# Patient Record
Sex: Male | Born: 1960 | Race: White | Hispanic: No | Marital: Married | Smoking: Former smoker
Health system: Southern US, Community
[De-identification: ages and names within clinical notes are randomized; demographics above are authoritative.]

## PROBLEM LIST (undated history)

## (undated) DIAGNOSIS — I10 Essential (primary) hypertension: Secondary | ICD-10-CM

## (undated) DIAGNOSIS — J449 Chronic obstructive pulmonary disease, unspecified: Secondary | ICD-10-CM

## (undated) DIAGNOSIS — J45909 Unspecified asthma, uncomplicated: Secondary | ICD-10-CM

## (undated) HISTORY — PX: KNEE SURGERY: SHX244

## (undated) HISTORY — PX: WISDOM TOOTH EXTRACTION: SHX21

---

## 2020-08-12 ENCOUNTER — Inpatient Hospital Stay
Admission: EM | Admit: 2020-08-12 | Discharge: 2020-08-14 | DRG: 871 | Disposition: A | Discharge status: Home / Self Care | Payer: BC Managed Care – PPO | Source: Ambulatory Visit | Attending: Internal Medicine | Admitting: Internal Medicine

## 2020-08-12 ENCOUNTER — Other Ambulatory Visit: Payer: Self-pay

## 2020-08-12 ENCOUNTER — Emergency Department: Payer: BC Managed Care – PPO

## 2020-08-12 DIAGNOSIS — J4541 Moderate persistent asthma with (acute) exacerbation: Secondary | ICD-10-CM

## 2020-08-12 DIAGNOSIS — E876 Hypokalemia: Secondary | ICD-10-CM | POA: Diagnosis present

## 2020-08-12 DIAGNOSIS — Z87891 Personal history of nicotine dependence: Secondary | ICD-10-CM

## 2020-08-12 DIAGNOSIS — U071 COVID-19: Secondary | ICD-10-CM | POA: Diagnosis present

## 2020-08-12 DIAGNOSIS — I10 Essential (primary) hypertension: Secondary | ICD-10-CM | POA: Diagnosis present

## 2020-08-12 DIAGNOSIS — R0902 Hypoxemia: Secondary | ICD-10-CM

## 2020-08-12 DIAGNOSIS — J440 Chronic obstructive pulmonary disease with acute lower respiratory infection: Secondary | ICD-10-CM | POA: Diagnosis present

## 2020-08-12 DIAGNOSIS — J449 Chronic obstructive pulmonary disease, unspecified: Secondary | ICD-10-CM | POA: Diagnosis not present

## 2020-08-12 DIAGNOSIS — R197 Diarrhea, unspecified: Secondary | ICD-10-CM | POA: Diagnosis present

## 2020-08-12 DIAGNOSIS — R0602 Shortness of breath: Secondary | ICD-10-CM

## 2020-08-12 DIAGNOSIS — Z825 Family history of asthma and other chronic lower respiratory diseases: Secondary | ICD-10-CM | POA: Diagnosis not present

## 2020-08-12 DIAGNOSIS — I2699 Other pulmonary embolism without acute cor pulmonale: Secondary | ICD-10-CM | POA: Diagnosis present

## 2020-08-12 DIAGNOSIS — A419 Sepsis, unspecified organism: Secondary | ICD-10-CM | POA: Diagnosis not present

## 2020-08-12 DIAGNOSIS — J441 Chronic obstructive pulmonary disease with (acute) exacerbation: Secondary | ICD-10-CM | POA: Diagnosis present

## 2020-08-12 DIAGNOSIS — J9601 Acute respiratory failure with hypoxia: Secondary | ICD-10-CM | POA: Diagnosis present

## 2020-08-12 DIAGNOSIS — Z8249 Family history of ischemic heart disease and other diseases of the circulatory system: Secondary | ICD-10-CM | POA: Diagnosis not present

## 2020-08-12 DIAGNOSIS — J45901 Unspecified asthma with (acute) exacerbation: Secondary | ICD-10-CM | POA: Diagnosis present

## 2020-08-12 DIAGNOSIS — J1282 Pneumonia due to coronavirus disease 2019: Secondary | ICD-10-CM | POA: Diagnosis present

## 2020-08-12 DIAGNOSIS — A4189 Other specified sepsis: Secondary | ICD-10-CM | POA: Diagnosis present

## 2020-08-12 HISTORY — DX: Chronic obstructive pulmonary disease, unspecified: J44.9

## 2020-08-12 HISTORY — DX: Essential (primary) hypertension: I10

## 2020-08-12 HISTORY — DX: Unspecified asthma, uncomplicated: J45.909

## 2020-08-12 LAB — COMPREHENSIVE METABOLIC PANEL
ALT: 33 U/L (ref 0–44)
AST: 31 U/L (ref 15–41)
Albumin: 4.1 g/dL (ref 3.5–5.0)
Alkaline Phosphatase: 62 U/L (ref 38–126)
Anion gap: 11 (ref 5–15)
BUN: 18 mg/dL (ref 6–20)
CO2: 25 mmol/L (ref 22–32)
Calcium: 8.9 mg/dL (ref 8.9–10.3)
Chloride: 101 mmol/L (ref 98–111)
Creatinine, Ser: 0.77 mg/dL (ref 0.61–1.24)
GFR, Estimated: >60 mL/min (ref >60)
Glucose, Bld: 143 mg/dL — ABNORMAL HIGH (ref 70–99)
Potassium: 3.4 mmol/L — ABNORMAL LOW (ref 3.5–5.1)
Sodium: 137 mmol/L (ref 135–145)
Total Bilirubin: 1.2 mg/dL (ref 0.3–1.2)
Total Protein: 7.6 g/dL (ref 6.5–8.1)

## 2020-08-12 LAB — HIV ANTIBODY (ROUTINE TESTING W REFLEX): HIV Screen 4th Generation wRfx: NONREACTIVE

## 2020-08-12 LAB — CBC WITH DIFFERENTIAL/PLATELET
Abs Immature Granulocytes: 0.08 10*3/uL — ABNORMAL HIGH (ref 0.00–0.07)
Basophils Absolute: 0.1 10*3/uL (ref 0.0–0.1)
Basophils Relative: 0 %
Eosinophils Absolute: 0 10*3/uL (ref 0.0–0.5)
Eosinophils Relative: 0 %
HCT: 43.7 % (ref 39.0–52.0)
Hemoglobin: 15 g/dL (ref 13.0–17.0)
Immature Granulocytes: 0 %
Lymphocytes Relative: 7 %
Lymphs Abs: 1.2 10*3/uL (ref 0.7–4.0)
MCH: 29.6 pg (ref 26.0–34.0)
MCHC: 34.3 g/dL (ref 30.0–36.0)
MCV: 86.2 fL (ref 80.0–100.0)
Monocytes Absolute: 2.2 10*3/uL — ABNORMAL HIGH (ref 0.1–1.0)
Monocytes Relative: 12 %
Neutro Abs: 14.5 10*3/uL — ABNORMAL HIGH (ref 1.7–7.7)
Neutrophils Relative %: 81 %
Platelets: 236 10*3/uL (ref 150–400)
RBC: 5.07 MIL/uL (ref 4.22–5.81)
RDW: 12.9 % (ref 11.5–15.5)
WBC: 18.1 10*3/uL — ABNORMAL HIGH (ref 4.0–10.5)
nRBC: 0 % (ref 0.0–0.2)

## 2020-08-12 LAB — C-REACTIVE PROTEIN: CRP: 15.7 mg/dL — ABNORMAL HIGH (ref <1.0)

## 2020-08-12 LAB — D-DIMER, QUANTITATIVE: D-Dimer, Quant: 1.69 ug/mL-FEU — ABNORMAL HIGH (ref 0.00–0.50)

## 2020-08-12 LAB — RESP PANEL BY RT-PCR (FLU A&B, COVID) ARPGX2
Influenza A by PCR: NEGATIVE
Influenza B by PCR: NEGATIVE
SARS Coronavirus 2 by RT PCR: NEGATIVE

## 2020-08-12 LAB — FERRITIN: Ferritin: 189 ng/mL (ref 24–336)

## 2020-08-12 LAB — LACTIC ACID, PLASMA
Lactic Acid, Venous: 1.2 mmol/L (ref 0.5–1.9)
Lactic Acid, Venous: 1.7 mmol/L (ref 0.5–1.9)

## 2020-08-12 LAB — PROCALCITONIN: Procalcitonin: <0.1 ng/mL

## 2020-08-12 LAB — MAGNESIUM: Magnesium: 1.7 mg/dL (ref 1.7–2.4)

## 2020-08-12 MED ORDER — DM-GUAIFENESIN ER 30-600 MG PO TB12
1.0000 | ORAL_TABLET | Freq: Two times a day (BID) | ORAL | Status: DC | PRN
  Administered 2020-08-13: 09:00:00 1 via ORAL
  Filled 2020-08-12: qty 1

## 2020-08-12 MED ORDER — ONDANSETRON HCL 4 MG/2ML IJ SOLN: 4.0000 mg | Freq: Three times a day (TID) | INTRAMUSCULAR | Status: DC | PRN

## 2020-08-12 MED ORDER — METHYLPREDNISOLONE SODIUM SUCC 40 MG IJ SOLR
40.0000 mg | Freq: Two times a day (BID) | INTRAMUSCULAR | Status: DC
  Administered 2020-08-12 – 2020-08-13 (×2): 40 mg via INTRAVENOUS
  Filled 2020-08-12 (×2): qty 1

## 2020-08-12 MED ORDER — LISINOPRIL 20 MG PO TABS
40.0000 mg | ORAL_TABLET | Freq: Every day | ORAL | Status: DC
  Administered 2020-08-13 – 2020-08-14 (×2): 40 mg via ORAL
  Filled 2020-08-12 (×2): qty 2

## 2020-08-12 MED ORDER — SODIUM CHLORIDE 0.9 % IV SOLN
200.0000 mg | Freq: Once | INTRAVENOUS | Status: AC
  Administered 2020-08-12: 200 mg via INTRAVENOUS
  Filled 2020-08-12: qty 200

## 2020-08-12 MED ORDER — SODIUM CHLORIDE 0.9 % IV SOLN
100.0000 mg | Freq: Every day | INTRAVENOUS | Status: DC
  Administered 2020-08-13: 09:00:00 100 mg via INTRAVENOUS
  Filled 2020-08-12 (×2): qty 20

## 2020-08-12 MED ORDER — ACETAMINOPHEN 325 MG PO TABS: 650.0000 mg | ORAL_TABLET | Freq: Four times a day (QID) | ORAL | Status: DC | PRN

## 2020-08-12 MED ORDER — SODIUM CHLORIDE 0.9 % IV BOLUS
500.0000 mL | Freq: Once | INTRAVENOUS | Status: AC
  Administered 2020-08-12: 500 mL via INTRAVENOUS

## 2020-08-12 MED ORDER — ENOXAPARIN SODIUM 40 MG/0.4ML IJ SOSY: 40.0000 mg | PREFILLED_SYRINGE | INTRAMUSCULAR | Status: DC

## 2020-08-12 MED ORDER — LOPERAMIDE HCL 2 MG PO CAPS: 2.0000 mg | ORAL_CAPSULE | Freq: Two times a day (BID) | ORAL | Status: DC | PRN

## 2020-08-12 MED ORDER — AMLODIPINE BESYLATE 10 MG PO TABS
10.0000 mg | ORAL_TABLET | Freq: Every day | ORAL | Status: DC
  Administered 2020-08-13 – 2020-08-14 (×2): 10 mg via ORAL
  Filled 2020-08-12 (×2): qty 1

## 2020-08-12 MED ORDER — AZITHROMYCIN 500 MG PO TABS
250.0000 mg | ORAL_TABLET | Freq: Every day | ORAL | Status: DC
  Administered 2020-08-13: 250 mg via ORAL
  Filled 2020-08-12: qty 1

## 2020-08-12 MED ORDER — IPRATROPIUM BROMIDE HFA 17 MCG/ACT IN AERS
2.0000 | INHALATION_SPRAY | RESPIRATORY_TRACT | Status: DC
  Administered 2020-08-12 – 2020-08-14 (×10): 2 via RESPIRATORY_TRACT
  Filled 2020-08-12 (×2): qty 12.9

## 2020-08-12 MED ORDER — HYDRALAZINE HCL 20 MG/ML IJ SOLN
5.0000 mg | INTRAMUSCULAR | Status: DC | PRN
  Administered 2020-08-12: 5 mg via INTRAVENOUS
  Filled 2020-08-12: qty 1

## 2020-08-12 MED ORDER — AZITHROMYCIN 500 MG PO TABS
500.0000 mg | ORAL_TABLET | Freq: Every day | ORAL | Status: AC
  Administered 2020-08-12: 500 mg via ORAL
  Filled 2020-08-12: qty 1

## 2020-08-12 MED ORDER — LABETALOL HCL 5 MG/ML IV SOLN
5.0000 mg | INTRAVENOUS | Status: DC | PRN
  Administered 2020-08-13: 21:00:00 5 mg via INTRAVENOUS
  Filled 2020-08-12: qty 4

## 2020-08-12 MED ORDER — DEXAMETHASONE SODIUM PHOSPHATE 10 MG/ML IJ SOLN
10.0000 mg | Freq: Once | INTRAMUSCULAR | Status: DC
  Filled 2020-08-12: qty 1

## 2020-08-12 MED ORDER — ENOXAPARIN SODIUM 60 MG/0.6ML IJ SOSY
0.5000 mg/kg | PREFILLED_SYRINGE | INTRAMUSCULAR | Status: DC
  Administered 2020-08-12: 47.5 mg via SUBCUTANEOUS
  Filled 2020-08-12: qty 0.6

## 2020-08-12 MED ORDER — SODIUM CHLORIDE 0.9 % IV BOLUS
1000.0000 mL | Freq: Once | INTRAVENOUS | Status: AC
  Administered 2020-08-12: 1000 mL via INTRAVENOUS

## 2020-08-12 MED ORDER — ALBUTEROL SULFATE HFA 108 (90 BASE) MCG/ACT IN AERS
2.0000 | INHALATION_SPRAY | RESPIRATORY_TRACT | Status: DC | PRN
  Filled 2020-08-12: qty 6.7

## 2020-08-12 MED ORDER — POTASSIUM CHLORIDE CRYS ER 20 MEQ PO TBCR
40.0000 meq | EXTENDED_RELEASE_TABLET | Freq: Once | ORAL | Status: AC
  Administered 2020-08-12: 40 meq via ORAL
  Filled 2020-08-12: qty 2

## 2020-08-12 NOTE — ED Notes (Addendum)
1 set of cultures, grey, and blue sent to lab

## 2020-08-12 NOTE — Progress Notes (Signed)
PHARMACIST - PHYSICIAN COMMUNICATION  CONCERNING:  Enoxaparin (Lovenox) for DVT Prophylaxis    RECOMMENDATION: Patient was prescribed enoxaprin 40mg  q24 hours for VTE prophylaxis.   Filed Weights   08/12/20 1555  Weight: 97.1 kg (214 lb)    Body mass index is 30.71 kg/m.  Estimated Creatinine Clearance: 116.2 mL/min (by C-G formula based on SCr of 0.77 mg/dL).   Based on [facility] policy patient is candidate for enoxaparin 0.5mg /kg TBW SQ every 24 hours based on BMI being >30.  DESCRIPTION: Pharmacy has adjusted enoxaparin dose per [facility] policy.  Patient is now receiving enoxaparin 47.5 mg every 24 hours    [facility], PharmD, BCPS Clinical Pharmacist 08/12/2020 5:36 PM

## 2020-08-12 NOTE — ED Provider Notes (Signed)
[facility] Emergency Department Provider Note   ____________________________________________   I have reviewed the triage vital signs and the nursing notes.   HISTORY  Chief Complaint Shortness of Breath   History limited by: Not Limited   HPI Timothy Keller is a 60 y.o. male who presents to the emergency department today from urgent care because of concern for shortness of breath and hypoxia in the setting of positive rapid covid test. The patient states that he has been feeling bad for the past roughly 3 days. His daughter had been sick although tested negative for COVID. The patient states his symptoms have involved shortness of breath and cough. Has had some diarrhea. Weakness. The patient does have history of asthma and used his home treatments with temporary relief.    Records reviewed. Per medical record review patient has a history of asthma,copd.   Past Medical History:  Diagnosis Date  . Asthma   . COPD (chronic obstructive pulmonary disease) (HCC)     There are no problems to display for this patient.   Past Surgical History:  Procedure Laterality Date  . KNEE SURGERY    . WISDOM TOOTH EXTRACTION      Prior to Admission medications   Not on File    Allergies Patient has no known allergies.  No family history on file.  Social History Social History   Tobacco Use  . Smoking status: Never Smoker    Review of Systems Constitutional: Positive for generalized weakness.  Eyes: No visual changes. ENT: No sore throat. Cardiovascular: Denies chest pain. Respiratory: Positive for shortness of breath. Gastrointestinal: Positive for diarrhea.  Genitourinary: Negative for dysuria. Musculoskeletal: Negative for back pain. Skin: Negative for rash. Neurological: Negative for headaches, focal weakness or numbness.  ____________________________________________   PHYSICAL EXAM:  VITAL SIGNS: ED Triage Vitals  Enc Vitals Group      BP 08/12/20 1554 (!) 174/97     Pulse Rate 08/12/20 1554 (!) 117     Resp 08/12/20 1554 (!) 24     Temp 08/12/20 1554 98.8 F (37.1 C)     Temp Source 08/12/20 1554 Oral     SpO2 08/12/20 1554 98 %     Weight 08/12/20 1555 214 lb (97.1 kg)     Height 08/12/20 1555 5\' 10"  (1.778 m)     Head Circumference --      Peak Flow --      Pain Score 08/12/20 1555 0    Constitutional: Alert and oriented.  Eyes: Conjunctivae are normal.  ENT      Head: Normocephalic and atraumatic.      Nose: No congestion/rhinnorhea.      Mouth/Throat: Mucous membranes are moist.      Neck: No stridor. Hematological/Lymphatic/Immunilogical: No cervical lymphadenopathy. Cardiovascular: Tachycardic, regular rhythm.  No murmurs, rubs, or gallops.  Respiratory: Slightly increased respiratory effort. Diffuse expiratory wheezing.  Gastrointestinal: Soft and non tender. No rebound. No guarding.  Genitourinary: Deferred Musculoskeletal: Normal range of motion in all extremities. No lower extremity edema. Neurologic:  Normal speech and language. No gross focal neurologic deficits are appreciated.  Skin:  Skin is warm, dry and intact. No rash noted. Psychiatric: Mood and affect are normal. Speech and behavior are normal. Patient exhibits appropriate insight and judgment.  ____________________________________________    LABS (pertinent positives/negatives)  CBC wbc 18.1, hgb 15.0, plt 236 CMP wnl except k 3.4, glu 143  ____________________________________________   EKG  I, 08/14/20, attending physician, personally viewed and interpreted  this EKG  EKG Time: 1604 Rate: 115 Rhythm: sinus tachycardia Axis: right axis deviation Intervals: qtc 497 QRS: RBBB, LPFB ST changes: no st elevation Impression: abnormal ekg  ____________________________________________    RADIOLOGY  CXR No active  disease  ____________________________________________   PROCEDURES  Procedures  ____________________________________________   INITIAL IMPRESSION / ASSESSMENT AND PLAN / ED COURSE  Pertinent labs & imaging results that were available during my care of the patient were reviewed by me and considered in my medical decision making (see chart for details).  Patient presents to the emergency department today because of concern for shortness of breath and hypoxia in the setting of positive covid test at urgent care. On exam here patient does have increased work of breathing and diffuse expiratory wheezing. Patient was found to be hypoxic to 88 on room air while talking. Patient was placed on 2L [place]. Will plan on starting steroids and remdisivir. Will plan on admission to the hospitalist service.  ____________________________________________   FINAL CLINICAL IMPRESSION(S) / ED DIAGNOSES  Final diagnoses:  SOB (shortness of breath)  Hypoxia     Note: This dictation was prepared with Dragon dictation. Any transcriptional errors that result from this process are unintentional     Phineas Semen, MD 08/12/20 [id number]

## 2020-08-12 NOTE — H&P (Addendum)
History and Physical    Timothy Keller QMG:[id number] DOB: [birth year] DOA: 08/12/2020  Referring MD/NP/PA:   PCP: Pcp, No   Patient coming from:  The patient is coming from home.  At baseline, pt is independent for most of ADL.        Chief Complaint: Shortness of breath, cough  HPI: Timothy Keller is a 60 y.o. male with medical history significant of COPD, asthma, HTN, who presents with shortness breath and cough.  Patient states that he has been feeling sick for more than 3 days.  Patient has dry cough and shortness breath, which has been progressively worsening.  Has chest tightness, denies chest pain.  No fever or chills.  He has malaise and generalized weakness. His daughter had been sick although tested negative for COVID.  Patient has some mild diarrhea, denies nausea, vomiting or abdominal pain.  No symptoms of UTI. Patient was seen in urgent care today, and had positive rapid COVID test, therefore sent to ED for further evaluation and treatment. He was found to have oxygen desaturated to 80s on room air, 91-98% on 2 L oxygen in ED. Patient states that he received 2 doses of COVID-19 vaccine.  ED Course: pt was found to have WBC 18.1, pending COVID-19 PCR, potassium 3.4, renal function okay, temperature normal, blood pressure 137/89, heart rate 117, RR 24, chest x-ray negative for infiltration.  Patient is admitted to MedSurg bed as inpatient.   Review of Systems:   General: no fevers, chills, no body weight gain, has fatigue HEENT: no blurry vision, hearing changes or sore throat Respiratory: has dyspnea, coughing, wheezing CV: no chest pain, no palpitations GI: no nausea, vomiting, abdominal pain, has diarrhea, no constipation GU: no dysuria, burning on urination, increased urinary frequency, hematuria  Ext: no leg edema Neuro: no unilateral weakness, numbness, or tingling, no vision change or hearing loss Skin: no rash, no skin tear. MSK: No muscle spasm, no deformity, no  limitation of range of movement in spin Heme: No easy bruising.  Travel history: No recent long distant travel.  Allergy: No Known Allergies  Past Medical History:  Diagnosis Date  . Asthma   . COPD (chronic obstructive pulmonary disease) (HCC)   . HTN (hypertension)     Past Surgical History:  Procedure Laterality Date  . KNEE SURGERY    . WISDOM TOOTH EXTRACTION      Social History:  reports that he has quit smoking. He has never used smokeless tobacco. He reports previous alcohol use. He reports that he does not use drugs.  Family History:  Family History  Problem Relation Age of Onset  . Asthma Mother   . Hypertension Father      Prior to Admission medications   Not on File    Physical Exam: Vitals:   08/12/20 1554 08/12/20 1555 08/12/20 1630  BP: (!) 174/97  137/89  Pulse: (!) 117  (!) 114  Resp: (!) 24  20  Temp: 98.8 F (37.1 C)    TempSrc: Oral    SpO2: 98%  91%  Weight:  97.1 kg   Height:  5\' 10"  (1.778 m)    General: Not in acute distress HEENT:       Eyes: PERRL, EOMI, no scleral icterus.       ENT: No discharge from the ears and nose, no pharynx injection, no tonsillar enlargement.        Neck: No JVD, no bruit, no mass felt. Heme: No neck lymph node enlargement.  Cardiac: S1/S2, RRR, No murmurs, No gallops or rubs. Respiratory: Has wheezing bilaterally GI: Soft, nondistended, nontender, no rebound pain, no organomegaly, BS present. GU: No hematuria Ext: No pitting leg edema bilaterally. 1+DP/PT pulse bilaterally. Musculoskeletal: No joint deformities, No joint redness or warmth, no limitation of ROM in spin. Skin: No rashes.  Neuro: Alert, oriented X3, cranial nerves II-XII grossly intact, moves all extremities normally.   Psych: Patient is not psychotic, no suicidal or hemocidal ideation.  Labs on Admission: I have personally reviewed following labs and imaging studies  CBC: Recent Labs  Lab 08/12/20 1611  WBC 18.1*  NEUTROABS 14.5*   HGB 15.0  HCT 43.7  MCV 86.2  PLT 236   Basic Metabolic Panel: Recent Labs  Lab 08/12/20 1611  NA 137  K 3.4*  CL 101  CO2 25  GLUCOSE 143*  BUN 18  CREATININE 0.77  CALCIUM 8.9  MG 1.7   GFR: Estimated Creatinine Clearance: 116.2 mL/min (by C-G formula based on SCr of 0.77 mg/dL). Liver Function Tests: Recent Labs  Lab 08/12/20 1611  AST 31  ALT 33  ALKPHOS 62  BILITOT 1.2  PROT 7.6  ALBUMIN 4.1   No results for input(s): LIPASE, AMYLASE in the last 168 hours. No results for input(s): AMMONIA in the last 168 hours. Coagulation Profile: No results for input(s): INR, PROTIME in the last 168 hours. Cardiac Enzymes: No results for input(s): CKTOTAL, CKMB, CKMBINDEX, TROPONINI in the last 168 hours. BNP (last 3 results) No results for input(s): PROBNP in the last 8760 hours. HbA1C: No results for input(s): HGBA1C in the last 72 hours. CBG: No results for input(s): GLUCAP in the last 168 hours. Lipid Profile: No results for input(s): CHOL, HDL, LDLCALC, TRIG, CHOLHDL, LDLDIRECT in the last 72 hours. Thyroid Function Tests: No results for input(s): TSH, T4TOTAL, FREET4, T3FREE, THYROIDAB in the last 72 hours. Anemia Panel: No results for input(s): VITAMINB12, FOLATE, FERRITIN, TIBC, IRON, RETICCTPCT in the last 72 hours. Urine analysis: No results found for: COLORURINE, APPEARANCEUR, LABSPEC, PHURINE, GLUCOSEU, HGBUR, BILIRUBINUR, KETONESUR, PROTEINUR, UROBILINOGEN, NITRITE, LEUKOCYTESUR Sepsis Labs: @LABRCNTIP (procalcitonin:4,lacticidven:4) )No results found for this or any previous visit (from the past 240 hour(s)).   Radiological Exams on Admission: DG Chest Portable 1 View  Result Date: 08/12/2020 CLINICAL DATA:  Shortness of breath, productive cough. EXAM: PORTABLE CHEST 1 VIEW COMPARISON:  None. FINDINGS: The heart size and mediastinal contours are within normal limits. Both lungs are clear. The visualized skeletal structures are unremarkable. IMPRESSION:  No active disease. Electronically Signed   By: 08/14/2020 M.D.   On: 08/12/2020 16:23     EKG: I have personally reviewed.  Sinus rhythm, tachycardia, QTC 497, RAD, right bundle blockage   Assessment/Plan Principal Problem:   Acute hypoxemic respiratory failure due to COVID-19 [facility]) Active Problems:   COPD exacerbation (HCC)   Sepsis (HCC)   Hypokalemia   Asthma exacerbation   HTN (hypertension)   Sepsis and acute hypoxemic respiratory failure due to COVID-19, asthma/COPD exacerbation: Patient had positive rapid COVID test in urgent care, which is documented in the clinical note.  Patient has wheezing on auscultation, indicating asthma/COPD exacerbation. Patient meets criteria for sepsis with leukocytosis with WBC 18.1, tachycardia with heart rate up to 117, tachypnea with RR 24.  Oxygen desaturating to 80s on room air, currently 92% on 2 L oxygen.  Chest x-ray negative for infiltration.  Pending lactic acid level.  will admit to med-surg bed as inpt -Remdesivir per pharm -Z pak -  Solumedrol 40 mg bid -Bronchodilators -PRN Mucinex for cough -f/u Blood culture and sputum culture -Gentle IV fluid: 1.5 L of NS -will get Procalcitonin and trend lactic acid levels per sepsis protocol. -f/u inflammation markers -Will ask the patient to maintain an awake prone position for 16+ hours a day, if possible, with a minimum of 2-3 hours at a time -Will attempt to maintain euvolemia to a net negative fluid status -IF patient deteriorates, will consult PCCM and ID  Hypokalemia: K= 3.4 on admission. - Repleted - Check Mg level  HTN (hypertension) -as needed IV hydralazine -Continue home amlodipine and lisinopril -Hold home oral hydralazine since patient is at risk of developing hypotension due to sepsis   DVT ppx:  SQ Lovenox Code Status: Full code Family Communication:   Yes, patient's daughter by phone  Disposition Plan:  Anticipate discharge back to previous environment Consults  called: None Admission status and Level of care: Med-Surg:  as inpt    Status is: Inpatient  Remains inpatient appropriate because:Inpatient level of care appropriate due to severity of illness   Dispo: The patient is from: Home              Anticipated d/c is to: Home              Patient currently is not medically stable to d/c.   Difficult to place patient No          Date of Service 08/12/2020    Lorretta Harp [facility]   If 7PM-7AM, please contact night-coverage [email or web address] 08/12/2020, 6:11 PM

## 2020-08-12 NOTE — ED Triage Notes (Signed)
Pt arrives via EMS from [facility] d/t COVID+ and SHOB- pt O2 sats per EMS were in the 80's on RA- pt on 2L [place] on arrival- pt denies any chest pain

## 2020-08-12 NOTE — ED Notes (Signed)
X-ray at bedside

## 2020-08-12 NOTE — Progress Notes (Signed)
Remdesivir - Pharmacy Brief Note   O:  ALT: 33 CXR: No active disease. SpO2: 91% on RA   A/P:  Remdesivir 200 mg IVPB once followed by 100 mg IVPB daily x 4 days.   Gardner Candle, PharmD, BCPS Clinical Pharmacist 08/12/2020 4:57 PM

## 2020-08-12 NOTE — Progress Notes (Signed)
   08/12/20 2133  Assess: MEWS Score  Temp (!) 97.5 F (36.4 C)  BP (!) 174/103  Pulse Rate (!) 112  Resp (!) 24  SpO2 93 %  O2 Device Room Air  Assess: MEWS Score  MEWS Temp 0  MEWS Systolic 0  MEWS Pulse 2  MEWS RR 1  MEWS LOC 0  MEWS Score 3  MEWS Score Color Yellow  Assess: if the MEWS score is Yellow or Red  Were vital signs taken at a resting state? Yes  Focused Assessment No change from prior assessment  Early Detection of Sepsis Score *See Row Information* Medium  Treat  MEWS Interventions Administered prn meds/treatments;Escalated (See documentation below)  Pain Scale 0-10  Pain Score 0  Take Vital Signs  Increase Vital Sign Frequency  Yellow: Q 2hr X 2 then Q 4hr X 2, if remains yellow, continue Q 4hrs  Escalate  MEWS: Escalate Yellow: discuss with charge nurse/RN and consider discussing with provider and RRT  Notify: Charge Nurse/RN  Name of Charge Nurse/RN Notified Jackie, RN  Date Charge Nurse/RN Notified 08/12/20  Time Charge Nurse/RN Notified 2140  Notify: Provider  Provider Name/Title Manuela Schwartz, NP  Date Provider Notified 08/12/20  Time Provider Notified 2155  Notification Type  (secure chat)  Notification Reason  (Yellow MEWS)  Provider response See new orders  Date of Provider Response 08/12/20  Time of Provider Response 2204  IV hydralizine given. 2L [place] applied. Will continue to monitor

## 2020-08-13 ENCOUNTER — Inpatient Hospital Stay: Payer: BC Managed Care – PPO

## 2020-08-13 DIAGNOSIS — U071 COVID-19: Secondary | ICD-10-CM | POA: Diagnosis not present

## 2020-08-13 DIAGNOSIS — J9601 Acute respiratory failure with hypoxia: Secondary | ICD-10-CM | POA: Diagnosis not present

## 2020-08-13 LAB — COMPREHENSIVE METABOLIC PANEL
ALT: 34 U/L (ref 0–44)
AST: 31 U/L (ref 15–41)
Albumin: 3.9 g/dL (ref 3.5–5.0)
Alkaline Phosphatase: 59 U/L (ref 38–126)
Anion gap: 8 (ref 5–15)
BUN: 19 mg/dL (ref 6–20)
CO2: 27 mmol/L (ref 22–32)
Calcium: 8.8 mg/dL — ABNORMAL LOW (ref 8.9–10.3)
Chloride: 105 mmol/L (ref 98–111)
Creatinine, Ser: 0.63 mg/dL (ref 0.61–1.24)
GFR, Estimated: >60 mL/min (ref >60)
Glucose, Bld: 138 mg/dL — ABNORMAL HIGH (ref 70–99)
Potassium: 4.2 mmol/L (ref 3.5–5.1)
Sodium: 140 mmol/L (ref 135–145)
Total Bilirubin: 0.7 mg/dL (ref 0.3–1.2)
Total Protein: 7.4 g/dL (ref 6.5–8.1)

## 2020-08-13 LAB — CBC WITH DIFFERENTIAL/PLATELET
Abs Immature Granulocytes: 0.07 10*3/uL (ref 0.00–0.07)
Basophils Absolute: 0 10*3/uL (ref 0.0–0.1)
Basophils Relative: 0 %
Eosinophils Absolute: 0 10*3/uL (ref 0.0–0.5)
Eosinophils Relative: 0 %
HCT: 44.1 % (ref 39.0–52.0)
Hemoglobin: 14.7 g/dL (ref 13.0–17.0)
Immature Granulocytes: 1 %
Lymphocytes Relative: 9 %
Lymphs Abs: 1.2 10*3/uL (ref 0.7–4.0)
MCH: 29.2 pg (ref 26.0–34.0)
MCHC: 33.3 g/dL (ref 30.0–36.0)
MCV: 87.5 fL (ref 80.0–100.0)
Monocytes Absolute: 0.7 10*3/uL (ref 0.1–1.0)
Monocytes Relative: 5 %
Neutro Abs: 11.7 10*3/uL — ABNORMAL HIGH (ref 1.7–7.7)
Neutrophils Relative %: 85 %
Platelets: 230 10*3/uL (ref 150–400)
RBC: 5.04 MIL/uL (ref 4.22–5.81)
RDW: 12.9 % (ref 11.5–15.5)
WBC: 13.7 10*3/uL — ABNORMAL HIGH (ref 4.0–10.5)
nRBC: 0 % (ref 0.0–0.2)

## 2020-08-13 LAB — C-REACTIVE PROTEIN: CRP: 16.8 mg/dL — ABNORMAL HIGH (ref <1.0)

## 2020-08-13 LAB — APTT: aPTT: 29 seconds (ref 24–36)

## 2020-08-13 LAB — D-DIMER, QUANTITATIVE: D-Dimer, Quant: 1.67 ug/mL-FEU — ABNORMAL HIGH (ref 0.00–0.50)

## 2020-08-13 MED ORDER — AZITHROMYCIN 500 MG PO TABS
500.0000 mg | ORAL_TABLET | Freq: Every day | ORAL | Status: DC
  Administered 2020-08-14: 500 mg via ORAL
  Filled 2020-08-13: qty 1

## 2020-08-13 MED ORDER — AZITHROMYCIN 500 MG PO TABS
250.0000 mg | ORAL_TABLET | Freq: Once | ORAL | Status: AC
  Administered 2020-08-13: 13:00:00 250 mg via ORAL
  Filled 2020-08-13: qty 1

## 2020-08-13 MED ORDER — ALBUTEROL SULFATE HFA 108 (90 BASE) MCG/ACT IN AERS
2.0000 | INHALATION_SPRAY | Freq: Four times a day (QID) | RESPIRATORY_TRACT | Status: DC
  Administered 2020-08-13 – 2020-08-14 (×3): 2 via RESPIRATORY_TRACT
  Filled 2020-08-13 (×2): qty 6.7

## 2020-08-13 MED ORDER — HEPARIN (PORCINE) 25000 UT/250ML-% IV SOLN
1800.0000 [IU]/h | INTRAVENOUS | Status: DC
  Administered 2020-08-13: 19:00:00 1500 [IU]/h via INTRAVENOUS
  Administered 2020-08-14: 05:00:00 1800 [IU]/h via INTRAVENOUS
  Filled 2020-08-13 (×2): qty 250

## 2020-08-13 MED ORDER — TECHNETIUM TO 99M ALBUMIN AGGREGATED
4.0000 | Freq: Once | INTRAVENOUS | Status: AC | PRN
  Administered 2020-08-13: 4.18 via INTRAVENOUS

## 2020-08-13 MED ORDER — HEPARIN BOLUS VIA INFUSION
5600.0000 [IU] | Freq: Once | INTRAVENOUS | Status: AC
  Administered 2020-08-13: 5600 [IU] via INTRAVENOUS
  Filled 2020-08-13: qty 5600

## 2020-08-13 MED ORDER — METHYLPREDNISOLONE SODIUM SUCC 125 MG IJ SOLR
60.0000 mg | Freq: Three times a day (TID) | INTRAMUSCULAR | Status: DC
  Administered 2020-08-13 – 2020-08-14 (×3): 60 mg via INTRAVENOUS
  Filled 2020-08-13 (×4): qty 2

## 2020-08-13 NOTE — Progress Notes (Signed)
Pt is requesting to see MD prior to start of heparin drip. MD, B. Regalado and Pharmacist, Darl Pikes,  made aware

## 2020-08-13 NOTE — Progress Notes (Signed)
PROGRESS NOTE    Timothy Keller  NIO:[id number] DOB: [birth year] DOA: 08/12/2020 PCP: Pcp, No   Brief Narrative: 60 year old with past medical history significant for COPD, asthma, hypertension who presents with shortness of breath and cough.  Patient presented to the urgent care the day of admission and had a positive rapid COVID test he was referred to the ED for admission.  Patient was found to be hypoxic oxygen sat 88 on room air.  Patient has received 2 doses of COVID-19 vaccine.  Evaluation in the ED: White blood cell 18, heart rate 117 respiration rate 24 chest x-ray negative for infiltrates.    Assessment & Plan:   Principal Problem:   Acute hypoxemic respiratory failure due to COVID-19 [facility]) Active Problems:   COPD exacerbation (HCC)   Sepsis (HCC)   Hypokalemia   Asthma exacerbation   HTN (hypertension)  1-Acute hypoxic respiratory failure presume to COVID-19 pneumonia Sepsis  Asthma/COPD exacerbation: Continue with IV steroids, nebulizer. Guaifenesin.  Continue with Antibiotics, Azithromycin.  D dimer elevated, check V-Q scan.  Covid PCR negative. Rapid covid positive from Urgent care. Plan to repeat PCR. Nurse will request record from urgent care.  On Remdesivir day 2.   Hypokalemia: Replaced.   Hypertension: Continue with Norvasc and lisinopril.   Leukocytosis;  -Pro-calcitonin less than 0.10.  -On Azithromycin      Estimated body mass index is 30.71 kg/m as calculated from the following:   Height as of this encounter: 5\' 10"  (1.778 m).   Weight as of this encounter: 97.1 kg.   DVT prophylaxis: Lovenox Code Status: Full code Family Communication: care discussed with patient.  Disposition Plan:  Status is: Inpatient  Remains inpatient appropriate because:IV treatments appropriate due to intensity of illness or inability to take PO   Dispo: The patient is from: Home              Anticipated d/c is to: Home              Patient currently is not  medically stable to d/c.   Difficult to place patient No        Consultants:   None  Procedures:   None  Antimicrobials:    Subjective: Patient report feeling a little better. He couldn't sleep last night. He report cough, SOB.  His daughter was sick, with viral PNA, she was negative for covid.  He would like to go home today.   Objective: Vitals:   08/12/20 2133 08/12/20 2339 08/13/20 0158 08/13/20 0447  BP: (!) 174/103 (!) 161/98 (!) 150/95 (!) 149/103  Pulse: (!) 112 99 100 89  Resp: (!) 24 (!) 22 18 18   Temp: (!) 97.5 F (36.4 C) 98.4 F (36.9 C) 98.1 F (36.7 C) 97.6 F (36.4 C)  TempSrc: Oral Oral Oral Oral  SpO2: 93% 95% 96% 99%  Weight:      Height:        Intake/Output Summary (Last 24 hours) at 08/13/2020 0730 Last data filed at 08/13/2020 0426 Gross per 24 hour  Intake 1750 ml  Output 475 ml  Net 1275 ml   Filed Weights   08/12/20 1555  Weight: 97.1 kg    Examination:  General exam: Appears calm and comfortable  Respiratory system: Tachypnea, BL wheezing.  Cardiovascular system: S1 & S2 heard, RRR. No JVD, murmurs, rubs, gallops or clicks. No pedal edema. Gastrointestinal system: Abdomen is nondistended, soft and nontender. No organomegaly or masses felt. Normal bowel sounds heard. Central nervous system: Alert  and oriented. No focal neurological deficits. Extremities: Symmetric 5 x 5 power.   Data Reviewed: I have personally reviewed following labs and imaging studies  CBC: Recent Labs  Lab 08/12/20 1611 08/13/20 0419  WBC 18.1* 13.7*  NEUTROABS 14.5* 11.7*  HGB 15.0 14.7  HCT 43.7 44.1  MCV 86.2 87.5  PLT 236 230   Basic Metabolic Panel: Recent Labs  Lab 08/12/20 1611 08/13/20 0419  NA 137 140  K 3.4* 4.2  CL 101 105  CO2 25 27  GLUCOSE 143* 138*  BUN 18 19  CREATININE 0.77 0.63  CALCIUM 8.9 8.8*  MG 1.7  --    GFR: Estimated Creatinine Clearance: 116.2 mL/min (by C-G formula based on SCr of 0.63 mg/dL). Liver  Function Tests: Recent Labs  Lab 08/12/20 1611 08/13/20 0419  AST 31 31  ALT 33 34  ALKPHOS 62 59  BILITOT 1.2 0.7  PROT 7.6 7.4  ALBUMIN 4.1 3.9   No results for input(s): LIPASE, AMYLASE in the last 168 hours. No results for input(s): AMMONIA in the last 168 hours. Coagulation Profile: No results for input(s): INR, PROTIME in the last 168 hours. Cardiac Enzymes: No results for input(s): CKTOTAL, CKMB, CKMBINDEX, TROPONINI in the last 168 hours. BNP (last 3 results) No results for input(s): PROBNP in the last 8760 hours. HbA1C: No results for input(s): HGBA1C in the last 72 hours. CBG: No results for input(s): GLUCAP in the last 168 hours. Lipid Profile: No results for input(s): CHOL, HDL, LDLCALC, TRIG, CHOLHDL, LDLDIRECT in the last 72 hours. Thyroid Function Tests: No results for input(s): TSH, T4TOTAL, FREET4, T3FREE, THYROIDAB in the last 72 hours. Anemia Panel: Recent Labs    08/12/20 1611  FERRITIN 189   Sepsis Labs: Recent Labs  Lab 08/12/20 1611 08/12/20 1826  PROCALCITON <0.10  --   LATICACIDVEN 1.2 1.7    Recent Results (from the past 240 hour(s))  Culture, blood (x 2)     Status: None (Preliminary result)   Collection Time: 08/12/20  4:11 PM   Specimen: BLOOD  Result Value Ref Range Status   Specimen Description BLOOD BLOOD LEFT FOREARM  Final   Special Requests   Final    BOTTLES DRAWN AEROBIC AND ANAEROBIC Blood Culture adequate volume   Culture   Final    NO GROWTH < 24 HOURS Performed at [facility], [address], [place], [place] [address]    Report Status PENDING  Incomplete  Resp Panel by RT-PCR (Flu A&B, Covid) Nasopharyngeal Swab     Status: None   Collection Time: 08/12/20  6:26 PM   Specimen: Nasopharyngeal Swab; Nasopharyngeal(NP) swabs in vial transport medium  Result Value Ref Range Status   SARS Coronavirus 2 by RT PCR NEGATIVE NEGATIVE Final    Comment: (NOTE) SARS-CoV-2 target nucleic acids are NOT  DETECTED.  The SARS-CoV-2 RNA is generally detectable in upper respiratory specimens during the acute phase of infection. The lowest concentration of SARS-CoV-2 viral copies this assay can detect is 138 copies/mL. A negative result does not preclude SARS-Cov-2 infection and should not be used as the sole basis for treatment or other patient management decisions. A negative result may occur with  improper specimen collection/handling, submission of specimen other than nasopharyngeal swab, presence of viral mutation(s) within the areas targeted by this assay, and inadequate number of viral copies(<138 copies/mL). A negative result must be combined with clinical observations, patient history, and epidemiological information. The expected result is Negative.  Fact Sheet for  Patients:  BloggerCourse.com  Fact Sheet for Healthcare Providers:  SeriousBroker.it  This test is no t yet approved or cleared by the [place] FDA and  has been authorized for detection and/or diagnosis of SARS-CoV-2 by FDA under an Emergency Use Authorization (EUA). This EUA will remain  in effect (meaning this test can be used) for the duration of the COVID-19 declaration under Section 564(b)(1) of the Act, 21 U.S.C.section 360bbb-3(b)(1), unless the authorization is terminated  or revoked sooner.       Influenza A by PCR NEGATIVE NEGATIVE Final   Influenza B by PCR NEGATIVE NEGATIVE Final    Comment: (NOTE) The Xpert Xpress SARS-CoV-2/FLU/RSV plus assay is intended as an aid in the diagnosis of influenza from Nasopharyngeal swab specimens and should not be used as a sole basis for treatment. Nasal washings and aspirates are unacceptable for Xpert Xpress SARS-CoV-2/FLU/RSV testing.  Fact Sheet for Patients: BloggerCourse.com  Fact Sheet for Healthcare Providers: SeriousBroker.it  This test is not yet  approved or cleared by the [place] FDA and has been authorized for detection and/or diagnosis of SARS-CoV-2 by FDA under an Emergency Use Authorization (EUA). This EUA will remain in effect (meaning this test can be used) for the duration of the COVID-19 declaration under Section 564(b)(1) of the Act, 21 U.S.C. section 360bbb-3(b)(1), unless the authorization is terminated or revoked.  Performed at [facility], [address] [place], [place] [address]   Culture, blood (x 2)     Status: None (Preliminary result)   Collection Time: 08/12/20  6:26 PM   Specimen: BLOOD  Result Value Ref Range Status   Specimen Description BLOOD BLOOD RIGHT ARM  Final   Special Requests   Final    BOTTLES DRAWN AEROBIC AND ANAEROBIC Blood Culture adequate volume   Culture   Final    NO GROWTH < 12 HOURS Performed at [facility], [address], [place], [place] [address]    Report Status PENDING  Incomplete         Radiology Studies: DG Chest Portable 1 View  Result Date: 08/12/2020 CLINICAL DATA:  Shortness of breath, productive cough. EXAM: PORTABLE CHEST 1 VIEW COMPARISON:  None. FINDINGS: The heart size and mediastinal contours are within normal limits. Both lungs are clear. The visualized skeletal structures are unremarkable. IMPRESSION: No active disease. Electronically Signed   By: Lupita Raider M.D.   On: 08/12/2020 16:23        Scheduled Meds: . amLODipine  10 mg Oral Daily  . azithromycin  250 mg Oral Daily  . enoxaparin (LOVENOX) injection  0.5 mg/kg Subcutaneous Q24H  . ipratropium  2 puff Inhalation Q4H  . lisinopril  40 mg Oral Daily  . methylPREDNISolone (SOLU-MEDROL) injection  40 mg Intravenous Q12H   Continuous Infusions: . remdesivir 100 mg in NS 100 mL       LOS: 1 day    Time spent: 35 minutes.    Alba Cory, MD [facility]   If 7PM-7AM, please contact night-coverage [email or web address]  08/13/2020, 7:30 AM

## 2020-08-13 NOTE — Consult Note (Signed)
ANTICOAGULATION CONSULT NOTE - Follow Up Consult  Pharmacy Consult for Heparin gtt Indication: pulmonary embolus  No Known Allergies  Patient Measurements: Height: 5\' 10"  (177.8 cm) Weight: 97.1 kg (214 lb) IBW/kg (Calculated) : 73 Heparin Dosing Weight: 93 kg  Vital Signs: Temp: 97.9 F (36.6 C) (05/23 1546) Temp Source: Oral (05/23 1546) BP: 155/100 (05/23 1546) Pulse Rate: 99 (05/23 1546)  Labs: Recent Labs    08/12/20 1611 08/13/20 0419  HGB 15.0 14.7  HCT 43.7 44.1  PLT 236 230  CREATININE 0.77 0.63    Estimated Creatinine Clearance: 116.2 mL/min (by C-G formula based on SCr of 0.63 mg/dL).   Medications:  Outpatient: No AC/APT PTA; NKDA. Inpatient: LMWH ppx >> Hep gtt.  Heparin Dosing Weight: 93 kg  Assessment: 60yo male w/ h/o COPD/asthma, HTN who presents with SOB and cough to urgent careand had a positive rapid COVID test necessitating same day referral to the ED for admission. Further w/u in ED revealed elevated d-dimer and VQ scan positive for acute PE. Initial covid PCR neg, but pending repeat. Pharmacy consulted for the mgmt of hep gtt.   Date Time aPTT/HL Rate/Comment       Baseline Labs: aPTT - ordered/sent Hgb - 15 Plts - 236 D-dimer - 1.69    Goal of Therapy:  Heparin level 0.3-0.7 units/ml Monitor platelets by anticoagulation protocol: Yes   Plan:  Give 5600 units bolus x 1 (~60 un/kg) Start heparin infusion at 1500 units/hr (~16 un/k/h) Check anti-Xa level in 6 hours and daily while on heparin Continue to monitor H&H and platelets  60yo Timothy Keller 08/13/2020,4:35 PM

## 2020-08-14 DIAGNOSIS — J9601 Acute respiratory failure with hypoxia: Secondary | ICD-10-CM | POA: Diagnosis not present

## 2020-08-14 DIAGNOSIS — U071 COVID-19: Secondary | ICD-10-CM | POA: Diagnosis not present

## 2020-08-14 LAB — CBC
HCT: 41.7 % (ref 39.0–52.0)
Hemoglobin: 14.5 g/dL (ref 13.0–17.0)
MCH: 30 pg (ref 26.0–34.0)
MCHC: 34.8 g/dL (ref 30.0–36.0)
MCV: 86.2 fL (ref 80.0–100.0)
Platelets: 258 10*3/uL (ref 150–400)
RBC: 4.84 MIL/uL (ref 4.22–5.81)
RDW: 12.7 % (ref 11.5–15.5)
WBC: 18 10*3/uL — ABNORMAL HIGH (ref 4.0–10.5)
nRBC: 0 % (ref 0.0–0.2)

## 2020-08-14 LAB — HEPARIN LEVEL (UNFRACTIONATED): Heparin Unfractionated: <0.1 IU/mL — ABNORMAL LOW (ref 0.30–0.70)

## 2020-08-14 MED ORDER — HEPARIN BOLUS VIA INFUSION
2800.0000 [IU] | Freq: Once | INTRAVENOUS | Status: AC
  Administered 2020-08-14: 2800 [IU] via INTRAVENOUS
  Filled 2020-08-14: qty 2800

## 2020-08-14 MED ORDER — FLUTICASONE-SALMETEROL 250-50 MCG/ACT IN AEPB: 1.0000 | INHALATION_SPRAY | Freq: Two times a day (BID) | RESPIRATORY_TRACT | 1 refills | Status: AC

## 2020-08-14 MED ORDER — ALBUTEROL SULFATE (2.5 MG/3ML) 0.083% IN NEBU: 2.5000 mg | INHALATION_SOLUTION | Freq: Four times a day (QID) | RESPIRATORY_TRACT | 12 refills | Status: DC | PRN

## 2020-08-14 MED ORDER — APIXABAN 5 MG PO TABS: 10.0000 mg | ORAL_TABLET | Freq: Two times a day (BID) | ORAL | 0 refills | Status: DC

## 2020-08-14 MED ORDER — APIXABAN 5 MG PO TABS: 5.0000 mg | ORAL_TABLET | Freq: Two times a day (BID) | ORAL | Status: DC

## 2020-08-14 MED ORDER — APIXABAN 5 MG PO TABS: 5.0000 mg | ORAL_TABLET | Freq: Two times a day (BID) | ORAL | 4 refills | Status: DC

## 2020-08-14 MED ORDER — APIXABAN 5 MG PO TABS
10.0000 mg | ORAL_TABLET | Freq: Two times a day (BID) | ORAL | Status: DC
  Administered 2020-08-14: 10 mg via ORAL
  Filled 2020-08-14: qty 2

## 2020-08-14 MED ORDER — HYDRALAZINE HCL 50 MG PO TABS
25.0000 mg | ORAL_TABLET | Freq: Every day | ORAL | Status: DC
  Administered 2020-08-14: 25 mg via ORAL
  Filled 2020-08-14: qty 1

## 2020-08-14 MED ORDER — PREDNISONE 20 MG PO TABS: 40.0000 mg | ORAL_TABLET | Freq: Every day | ORAL | 0 refills | Status: AC

## 2020-08-14 MED ORDER — DM-GUAIFENESIN ER 30-600 MG PO TB12: 1.0000 | ORAL_TABLET | Freq: Two times a day (BID) | ORAL | 0 refills | Status: DC | PRN

## 2020-08-14 MED ORDER — PREDNISONE 20 MG PO TABS: 40.0000 mg | ORAL_TABLET | Freq: Every day | ORAL | Status: DC

## 2020-08-14 MED ORDER — AZITHROMYCIN 500 MG PO TABS: 500.0000 mg | ORAL_TABLET | Freq: Every day | ORAL | 0 refills | Status: DC

## 2020-08-14 NOTE — Discharge Summary (Signed)
Physician Discharge Summary  Timothy Keller ZOX:[id number] DOB: 1960-08-03 DOA: 08/12/2020  PCP: Pcp, No  Admit date: 08/12/2020 Discharge date: 08/14/2020  Admitted From: Home  Disposition:  Home   Recommendations for Outpatient Follow-up:  1. Follow up with PCP in 1-2 weeks 2. Please obtain BMP/CBC in one week 3. He will need hypercoagulable panel.  4. He will need 6 month treatment for PE>   Discharge Condition: Stable.  CODE STATUS: Full Code Diet recommendation: Heart Healthy  Brief/Interim Summary: 60 year old with past medical history significant for COPD, asthma, hypertension who presents with shortness of breath and cough.  Patient presented to the urgent care the day of admission and had a positive rapid COVID test he was referred to the ED for admission.  Patient was found to be hypoxic oxygen sat 88 on room air.  Patient has received 2 doses of COVID-19 vaccine.  Evaluation in the ED: White blood cell 18, heart rate 117 respiration rate 24 chest x-ray negative for infiltrates.   1-Acute hypoxic respiratory failure presume to COVID-19 pneumonia Sepsis  Asthma/COPD exacerbation: -Treated with IV steroids, nebulizer. Guaifenesin.  -Continue with Antibiotics, Azithromycin. Complete 5 days.  -D dimer elevated, -VQ scan positive for PE>  -Covid PCR negative. Rapid antigen Covid test  positive from Urgent care.  -Repeated PCR obtained yesterday was send on wrong tube. Patient decline further testing.  -Received  Remdesivir day 2. he decline further Remdesivir  -Plan to discharge on prednisone, nebulizer, antibiotics. Advised patient to [employer].   Pulmonary Embolism;  -V-Q scan: Segmental perfusion defect LEFT lower lobe. Subsegmental perfusion defects elsewhere in both lungs. Positive exam for pulmonary embolism. -he was started on Heparin Gtt 5/23. -Today he is not requiring oxygen, HR has decreased. He is feeling better. Plan to transition to Eliquis.  -He will need  hypercoagulable work up out patient.  -He will need treatment for 6 months.  -He decline to proceed with doppler LE>  -He would like to go home today.  -PESI; score class two low risk for mortality.   Hypokalemia: Replaced.   Hypertension: Continue with Norvasc and lisinopril. Resume hydralazine.   Leukocytosis;  -Pro-calcitonin less than 0.10.  -On Azithromycin  -plan to complete 5 days of azithromycin.     Discharge Diagnoses:  Principal Problem:   Acute hypoxemic respiratory failure due to COVID-19 [facility]) Active Problems:   COPD exacerbation (HCC)   Sepsis (HCC)   Hypokalemia   Asthma exacerbation   HTN (hypertension)    Discharge Instructions  Discharge Instructions    Diet - low sodium heart healthy   Complete by: As directed    Increase activity slowly   Complete by: As directed      Allergies as of 08/14/2020   No Known Allergies     Medication List    TAKE these medications   albuterol (2.5 MG/3ML) 0.083% nebulizer solution Commonly known as: PROVENTIL Take 3 mLs (2.5 mg total) by nebulization every 6 (six) hours as needed for wheezing or shortness of breath.   amLODipine 10 MG tablet Commonly known as: NORVASC Take 10 mg by mouth daily.   apixaban 5 MG Tabs tablet Commonly known as: ELIQUIS Take 2 tablets (10 mg total) by mouth 2 (two) times daily for 7 days.   apixaban 5 MG Tabs tablet Commonly known as: ELIQUIS Take 1 tablet (5 mg total) by mouth 2 (two) times daily. Start taking on: Aug 21, 2020   azithromycin 500 MG tablet Commonly known as: ZITHROMAX Take 1  tablet (500 mg total) by mouth daily. Start taking on: Aug 15, 2020   cetirizine 10 MG tablet Commonly known as: ZYRTEC Take 10 mg by mouth daily as needed for allergies.   dextromethorphan-guaiFENesin 30-600 MG 12hr tablet Commonly known as: MUCINEX DM Take 1 tablet by mouth 2 (two) times daily as needed for cough.   fluticasone-salmeterol 250-50 MCG/ACT Aepb Commonly  known as: ADVAIR Inhale 1 puff into the lungs in the morning and at bedtime.   hydrALAZINE 25 MG tablet Commonly known as: APRESOLINE Take 25 mg by mouth daily.   lisinopril 40 MG tablet Commonly known as: ZESTRIL Take 40 mg by mouth daily.   predniSONE 20 MG tablet Commonly known as: DELTASONE Take 2 tablets (40 mg total) by mouth daily with breakfast for 5 days.       No Known Allergies  Consultations:  None   Procedures/Studies: NM Pulmonary Perfusion  Result Date: 08/13/2020 CLINICAL DATA:  COPD, asthma, sick for 3 days, COVID-19 negative; history asthma, COPD, hypertension EXAM: NUCLEAR MEDICINE PERFUSION LUNG SCAN TECHNIQUE: Perfusion images were obtained in multiple projections after intravenous injection of radiopharmaceutical. Ventilation scans intentionally deferred if perfusion scan and chest x-ray adequate for interpretation during COVID 19 epidemic. RADIOPHARMACEUTICALS:  4.18 mCi Tc-6261m MAA IV COMPARISON:  None Correlation: Chest radiograph 08/12/2020 FINDINGS: Segmental perfusion defect LEFT lower lobe. Subsegmental perfusion defects elsewhere in both lungs. Findings meet criteria for pulmonary embolism present. IMPRESSION: Segmental and subsegmental perfusion defects as above. Positive exam for pulmonary embolism. Findings called to Dr. Sunnie Nielsenegalado on 08/13/2020 at 1632 hours. Electronically Signed   By: Ulyses SouthwardMark  Boles M.D.   On: 08/13/2020 16:33   DG Chest Portable 1 View  Result Date: 08/12/2020 CLINICAL DATA:  Shortness of breath, productive cough. EXAM: PORTABLE CHEST 1 VIEW COMPARISON:  None. FINDINGS: The heart size and mediastinal contours are within normal limits. Both lungs are clear. The visualized skeletal structures are unremarkable. IMPRESSION: No active disease. Electronically Signed   By: Lupita RaiderJames  Green Jr M.D.   On: 08/12/2020 16:23     Subjective: He is breathing better. Cough has improved.    Discharge Exam: Vitals:   08/14/20 1004 08/14/20 1137   BP: (!) 155/99 (!) 155/92  Pulse: 96 90  Resp:  16  Temp:  98.4 F (36.9 C)  SpO2:  93%     General: Pt is alert, awake, not in acute distress Cardiovascular: RRR, S1/S2 +, no rubs, no gallops Respiratory: CTA bilaterally, no wheezing, no rhonchi Abdominal: Soft, NT, ND, bowel sounds + Extremities: no edema, no cyanosis    The results of significant diagnostics from this hospitalization (including imaging, microbiology, ancillary and laboratory) are listed below for reference.     Microbiology: Recent Results (from the past 240 hour(s))  Culture, blood (x 2)     Status: None (Preliminary result)   Collection Time: 08/12/20  4:11 PM   Specimen: BLOOD  Result Value Ref Range Status   Specimen Description BLOOD BLOOD LEFT FOREARM  Final   Special Requests   Final    BOTTLES DRAWN AEROBIC AND ANAEROBIC Blood Culture adequate volume   Culture   Final    NO GROWTH 2 DAYS Performed at [facility], [address] [place], [place] [address]    Report Status PENDING  Incomplete  Resp Panel by RT-PCR (Flu A&B, Covid) Nasopharyngeal Swab     Status: None   Collection Time: 08/12/20  6:26 PM   Specimen: Nasopharyngeal Swab; Nasopharyngeal(NP) swabs in vial  transport medium  Result Value Ref Range Status   SARS Coronavirus 2 by RT PCR NEGATIVE NEGATIVE Final    Comment: (NOTE) SARS-CoV-2 target nucleic acids are NOT DETECTED.  The SARS-CoV-2 RNA is generally detectable in upper respiratory specimens during the acute phase of infection. The lowest concentration of SARS-CoV-2 viral copies this assay can detect is 138 copies/mL. A negative result does not preclude SARS-Cov-2 infection and should not be used as the sole basis for treatment or other patient management decisions. A negative result may occur with  improper specimen collection/handling, submission of specimen other than nasopharyngeal swab, presence of viral mutation(s) within the areas targeted by this  assay, and inadequate number of viral copies(<138 copies/mL). A negative result must be combined with clinical observations, patient history, and epidemiological information. The expected result is Negative.  Fact Sheet for Patients:  BloggerCourse.com  Fact Sheet for Healthcare Providers:  SeriousBroker.it  This test is no t yet approved or cleared by the [place] FDA and  has been authorized for detection and/or diagnosis of SARS-CoV-2 by FDA under an Emergency Use Authorization (EUA). This EUA will remain  in effect (meaning this test can be used) for the duration of the COVID-19 declaration under Section 564(b)(1) of the Act, 21 U.S.C.section 360bbb-3(b)(1), unless the authorization is terminated  or revoked sooner.       Influenza A by PCR NEGATIVE NEGATIVE Final   Influenza B by PCR NEGATIVE NEGATIVE Final    Comment: (NOTE) The Xpert Xpress SARS-CoV-2/FLU/RSV plus assay is intended as an aid in the diagnosis of influenza from Nasopharyngeal swab specimens and should not be used as a sole basis for treatment. Nasal washings and aspirates are unacceptable for Xpert Xpress SARS-CoV-2/FLU/RSV testing.  Fact Sheet for Patients: BloggerCourse.com  Fact Sheet for Healthcare Providers: SeriousBroker.it  This test is not yet approved or cleared by the [place] FDA and has been authorized for detection and/or diagnosis of SARS-CoV-2 by FDA under an Emergency Use Authorization (EUA). This EUA will remain in effect (meaning this test can be used) for the duration of the COVID-19 declaration under Section 564(b)(1) of the Act, 21 U.S.C. section 360bbb-3(b)(1), unless the authorization is terminated or revoked.  Performed at [facility], [address] [place], [place] [address]   Culture, blood (x 2)     Status: None (Preliminary result)   Collection Time:  08/12/20  6:26 PM   Specimen: BLOOD  Result Value Ref Range Status   Specimen Description BLOOD BLOOD RIGHT ARM  Final   Special Requests   Final    BOTTLES DRAWN AEROBIC AND ANAEROBIC Blood Culture adequate volume   Culture   Final    NO GROWTH 2 DAYS Performed at [facility], [address], [place], [place] [address]    Report Status PENDING  Incomplete     Labs: BNP (last 3 results) No results for input(s): BNP in the last 8760 hours. Basic Metabolic Panel: Recent Labs  Lab 08/12/20 1611 08/13/20 0419  NA 137 140  K 3.4* 4.2  CL 101 105  CO2 25 27  GLUCOSE 143* 138*  BUN 18 19  CREATININE 0.77 0.63  CALCIUM 8.9 8.8*  MG 1.7  --    Liver Function Tests: Recent Labs  Lab 08/12/20 1611 08/13/20 0419  AST 31 31  ALT 33 34  ALKPHOS 62 59  BILITOT 1.2 0.7  PROT 7.6 7.4  ALBUMIN 4.1 3.9   No results for input(s): LIPASE, AMYLASE in  the last 168 hours. No results for input(s): AMMONIA in the last 168 hours. CBC: Recent Labs  Lab 08/12/20 1611 08/13/20 0419 08/14/20 0105  WBC 18.1* 13.7* 18.0*  NEUTROABS 14.5* 11.7*  --   HGB 15.0 14.7 14.5  HCT 43.7 44.1 41.7  MCV 86.2 87.5 86.2  PLT 236 230 258   Cardiac Enzymes: No results for input(s): CKTOTAL, CKMB, CKMBINDEX, TROPONINI in the last 168 hours. BNP: Invalid input(s): POCBNP CBG: No results for input(s): GLUCAP in the last 168 hours. D-Dimer Recent Labs    08/12/20 1611 08/13/20 0419  DDIMER 1.69* 1.67*   Hgb A1c No results for input(s): HGBA1C in the last 72 hours. Lipid Profile No results for input(s): CHOL, HDL, LDLCALC, TRIG, CHOLHDL, LDLDIRECT in the last 72 hours. Thyroid function studies No results for input(s): TSH, T4TOTAL, T3FREE, THYROIDAB in the last 72 hours.  Invalid input(s): FREET3 Anemia work up [employer]    08/12/20 1611  FERRITIN 189   Urinalysis No results found for: COLORURINE, APPEARANCEUR, LABSPEC, PHURINE, GLUCOSEU, HGBUR, BILIRUBINUR, KETONESUR,  PROTEINUR, UROBILINOGEN, NITRITE, LEUKOCYTESUR Sepsis Labs Invalid input(s): PROCALCITONIN,  WBC,  LACTICIDVEN Microbiology Recent Results (from the past 240 hour(s))  Culture, blood (x 2)     Status: None (Preliminary result)   Collection Time: 08/12/20  4:11 PM   Specimen: BLOOD  Result Value Ref Range Status   Specimen Description BLOOD BLOOD LEFT FOREARM  Final   Special Requests   Final    BOTTLES DRAWN AEROBIC AND ANAEROBIC Blood Culture adequate volume   Culture   Final    NO GROWTH 2 DAYS Performed at [facility], [address], [place], [place] [address]    Report Status PENDING  Incomplete  Resp Panel by RT-PCR (Flu A&B, Covid) Nasopharyngeal Swab     Status: None   Collection Time: 08/12/20  6:26 PM   Specimen: Nasopharyngeal Swab; Nasopharyngeal(NP) swabs in vial transport medium  Result Value Ref Range Status   SARS Coronavirus 2 by RT PCR NEGATIVE NEGATIVE Final    Comment: (NOTE) SARS-CoV-2 target nucleic acids are NOT DETECTED.  The SARS-CoV-2 RNA is generally detectable in upper respiratory specimens during the acute phase of infection. The lowest concentration of SARS-CoV-2 viral copies this assay can detect is 138 copies/mL. A negative result does not preclude SARS-Cov-2 infection and should not be used as the sole basis for treatment or other patient management decisions. A negative result may occur with  improper specimen collection/handling, submission of specimen other than nasopharyngeal swab, presence of viral mutation(s) within the areas targeted by this assay, and inadequate number of viral copies(<138 copies/mL). A negative result must be combined with clinical observations, patient history, and epidemiological information. The expected result is Negative.  Fact Sheet for Patients:  BloggerCourse.com  Fact Sheet for Healthcare Providers:  SeriousBroker.it  This test is no t yet  approved or cleared by the [place] FDA and  has been authorized for detection and/or diagnosis of SARS-CoV-2 by FDA under an Emergency Use Authorization (EUA). This EUA will remain  in effect (meaning this test can be used) for the duration of the COVID-19 declaration under Section 564(b)(1) of the Act, 21 U.S.C.section 360bbb-3(b)(1), unless the authorization is terminated  or revoked sooner.       Influenza A by PCR NEGATIVE NEGATIVE Final   Influenza B by PCR NEGATIVE NEGATIVE Final    Comment: (NOTE) The Xpert Xpress SARS-CoV-2/FLU/RSV plus assay is intended as an aid in the diagnosis of  influenza from Nasopharyngeal swab specimens and should not be used as a sole basis for treatment. Nasal washings and aspirates are unacceptable for Xpert Xpress SARS-CoV-2/FLU/RSV testing.  Fact Sheet for Patients: BloggerCourse.com  Fact Sheet for Healthcare Providers: SeriousBroker.it  This test is not yet approved or cleared by the [place] FDA and has been authorized for detection and/or diagnosis of SARS-CoV-2 by FDA under an Emergency Use Authorization (EUA). This EUA will remain in effect (meaning this test can be used) for the duration of the COVID-19 declaration under Section 564(b)(1) of the Act, 21 U.S.C. section 360bbb-3(b)(1), unless the authorization is terminated or revoked.  Performed at [facility], [address] [place], [place] [address]   Culture, blood (x 2)     Status: None (Preliminary result)   Collection Time: 08/12/20  6:26 PM   Specimen: BLOOD  Result Value Ref Range Status   Specimen Description BLOOD BLOOD RIGHT ARM  Final   Special Requests   Final    BOTTLES DRAWN AEROBIC AND ANAEROBIC Blood Culture adequate volume   Culture   Final    NO GROWTH 2 DAYS Performed at [facility], [address], [place], [place] [address]    Report Status PENDING  Incomplete     Time  coordinating discharge: 40 minutes  SIGNED:   Alba Cory, MD  [facility]

## 2020-08-14 NOTE — Progress Notes (Signed)
Pt is discharging home, DC education completed.

## 2020-08-14 NOTE — Consult Note (Signed)
ANTICOAGULATION CONSULT NOTE - Follow Up Consult  Pharmacy Consult for Heparin gtt Indication: pulmonary embolus  No Known Allergies  Patient Measurements: Height: 5\' 10"  (177.8 cm) Weight: 97.1 kg (214 lb) IBW/kg (Calculated) : 73 Heparin Dosing Weight: 93 kg  Vital Signs: Temp: 98.6 F (37 C) (05/23 2349) Temp Source: Oral (05/23 2349) BP: 158/97 (05/23 2349) Pulse Rate: 91 (05/23 2349)  Labs: Recent Labs    08/12/20 1611 08/13/20 0419 08/13/20 1719 08/14/20 0105  HGB 15.0 14.7  --  14.5  HCT 43.7 44.1  --  41.7  PLT 236 230  --  258  APTT  --   --  29  --   HEPARINUNFRC  --   --   --  <0.10*  CREATININE 0.77 0.63  --   --     Estimated Creatinine Clearance: 116.2 mL/min (by C-G formula based on SCr of 0.63 mg/dL).   Medications:  Outpatient: No AC/APT PTA; NKDA. Inpatient: LMWH ppx >> Hep gtt.  Heparin Dosing Weight: 93 kg  Assessment: 60yo male w/ h/o COPD/asthma, HTN who presents with SOB and cough to urgent careand had a positive rapid COVID test necessitating same day referral to the ED for admission. Further w/u in ED revealed elevated d-dimer and VQ scan positive for acute PE. Initial covid PCR neg, but pending repeat. Pharmacy consulted for the mgmt of hep gtt.   Date Time aPTT/HL Rate/Comment       Baseline Labs: aPTT - ordered/sent Hgb - 15 Plts - 236 D-dimer - 1.69    Goal of Therapy:  Heparin level 0.3-0.7 units/ml Monitor platelets by anticoagulation protocol: Yes   Plan:  5/24:  HL @ 0105 :  < 0.1  Will order Heparin 2800 units IV X 1 bolus and increase drip rate to 1800 units/hr.  Will recheck HL 6 hrs after rate change.   Marwan Lipe D 08/14/2020,4:01 AM

## 2020-08-14 NOTE — Progress Notes (Signed)
ANTICOAGULATION CONSULT NOTE - Initial Consult  Pharmacy Consult for apixaban Indication: pulmonary embolus  No Known Allergies  Patient Measurements: Height: 5\' 10"  (177.8 cm) Weight: 97.1 kg (214 lb) IBW/kg (Calculated) : 73 Heparin Dosing Weight:    Vital Signs: Temp: 98.2 F (36.8 C) (05/24 0743) Temp Source: Oral (05/24 0743) BP: 155/99 (05/24 1004) Pulse Rate: 96 (05/24 1004)  Labs: Recent Labs    08/12/20 1611 08/13/20 0419 08/13/20 1719 08/14/20 0105  HGB 15.0 14.7  --  14.5  HCT 43.7 44.1  --  41.7  PLT 236 230  --  258  APTT  --   --  29  --   HEPARINUNFRC  --   --   --  <0.10*  CREATININE 0.77 0.63  --   --     Estimated Creatinine Clearance: 116.2 mL/min (by C-G formula based on SCr of 0.63 mg/dL).   Medical History: Past Medical History:  Diagnosis Date  . Asthma   . COPD (chronic obstructive pulmonary disease) (HCC)   . HTN (hypertension)     Medications:  Scheduled:  . albuterol  2 puff Inhalation Q6H  . amLODipine  10 mg Oral Daily  . apixaban  10 mg Oral BID   Followed by  . [START ON 08/21/2020] apixaban  5 mg Oral BID  . azithromycin  500 mg Oral Daily  . hydrALAZINE  25 mg Oral Daily  . ipratropium  2 puff Inhalation Q4H  . lisinopril  40 mg Oral Daily  . predniSONE  40 mg Oral Q breakfast   Infusions:    Assessment: 60yo male w/ h/o COPD/asthma, HTN who presents with SOB and cough to urgent careand had a positive rapid COVID test necessitating same day referral to the ED for admission. Further w/u in ED revealed elevated d-dimer and VQ scan positive for acute PE. Initial covid PCR neg, but pending repeat. Pharmacy consulted for the mgmt of hep gttand now transitioning to apixaban. covid+  Goal of Therapy:  Monitor platelets by anticoagulation protocol: Yes   Plan:  Will order apixaban 10 mg po BID x 7 days then 5 mg BID for PE treatment. Heparin drip can be discontinued at time apixaban is given.  Hgb 14.5  Plt 258 -f/u  CBC/Scr per protocol  Ane Conerly A 08/14/2020,10:49 AM

## 2020-08-14 NOTE — Discharge Instructions (Signed)
Please Quarantine Until June 6.      Pulmonary Embolism  A pulmonary embolism (PE) is a sudden blockage or decrease of blood flow in one or both lungs that happens when a clot travels into the arteries of the lung (pulmonary arteries). Most blockages come from a blood clot that forms in the vein of a leg or arm (deep vein thrombosis, DVT) and travels to the lungs. A clot is blood that has thickened into a gel or solid. PE is a dangerous and life-threatening condition that needs to be treated right away. What are the causes? This condition is usually caused by a blood clot that forms in a vein and moves to the lungs. In rare cases, it may be caused by air, fat, part of a tumor, or other tissue that moves through the veins and into the lungs. What increases the risk? The following factors may make you more likely to develop this condition:  Experiencing a traumatic injury, such as breaking a hip or leg.  Having: ? A spinal cord injury. ? Major surgery, especially hip or knee replacement, or surgery on parts of the nervous system or on the abdomen. ? A stroke. ? A blood clotting disease. ? Long-term (chronic) lung or heart disease. ? Cancer, especially if you are being treated with chemotherapy. ? A central venous catheter.  Taking medicines that contain estrogen. These include birth control pills and hormone replacement therapy.  Being: ? Pregnant. ? In the period of time after your baby is delivered (postpartum). ? Older than age 20. ? Overweight. ? A smoker, especially if you have other risks. ? Not very active (sedentary), not being able to move at all, or spending long periods sitting, such as travel over 6 hours. You are also at a greater risk if you have a leg in a cast or splint. What are the signs or symptoms? Symptoms of this condition usually start suddenly and include:  Shortness of breath during activity or at rest.  Coughing, coughing up blood, or coughing up bloody  mucus.  Chest pain, back pain, or shoulder blade pain that gets worse with deep breaths.  Rapid or irregular heartbeat.  Feeling light-headed or dizzy, or fainting.  Feeling anxious.  Pain and swelling in a leg. This is a symptom of DVT, which can lead to PE. How is this diagnosed? This condition may be diagnosed based on your medical history, a physical exam, and tests. Tests may include:  Blood tests.  An ECG (electrocardiogram) of the heart.  A CT pulmonary angiogram. This test checks blood flow in and around your lungs.  A ventilation-perfusion scan, also called a lung VQ scan. This test measures air flow and blood flow to the lungs.  An ultrasound to check for a DVT. How is this treated? Treatment for this condition depends on many factors, such as the cause of your PE, your risk for bleeding or developing more clots, and other medical conditions you may have. Treatment aims to stop blood clots from forming or growing larger. In some cases, treatment may be aimed at breaking apart or removing the blood clot. Treatment may include:  Medicines, such as: ? Blood thinning medicines, also called anticoagulants, to stop clots from forming and growing. ? Medicines that break apart clots (thrombolytics).  Procedures, such as: ? Using a flexible tube to remove a blood clot (embolectomy) or to deliver medicine to destroy it (catheter-directed thrombolysis). ? Surgery to remove the clot (surgical embolectomy). This is rare.  You may need a combination of immediate, long-term, and extended treatments. Your treatment may continue for several months (maintenance therapy) or longer depending on your medical conditions. You and your health care provider will work together to choose the treatment program that is best for you. Follow these instructions at home: Medicines  Take over-the-counter and prescription medicines only as told by your health care provider.  If you are taking blood  thinners: ? Talk with your health care provider before you take any medicines that contain aspirin or NSAIDs, such as ibuprofen. These medicines increase your risk for dangerous bleeding. ? Take your medicine exactly as told, at the same time every day. ? Avoid activities that could cause injury or bruising, and follow instructions about how to prevent falls. ? Wear a medical alert bracelet or carry a card that lists what medicines you take.  Understand what foods and drugs interact with any medicines that you are taking. General instructions  Ask your health care provider when you may return to your normal activities. Avoid sitting or lying for a long time without moving.  Maintain a healthy weight. Ask your health care provider what weight is healthy for you.  Do not use any products that contain nicotine or tobacco, such as cigarettes, e-cigarettes, and chewing tobacco. If you need help quitting, ask your health care provider.  Talk with your health care provider about any travel plans. It is important to make sure that you are still able to take your medicine while traveling.  Keep all follow-up visits as told by your health care provider. This is important. Where to find more information  American Lung Association: www.lung.org  Centers for Disease Control and Prevention: [employer]Ex[employer].com.br Contact a health care provider if:  You missed a dose of your blood thinner medicine. Get help right away if you:  Have: ? New or increased pain, swelling, warmth, or redness in an arm or leg. ? Shortness of breath that gets worse during activity or at rest. ? A fever. ? Worsening chest pain. ? A rapid or irregular heartbeat. ? A severe headache. ? Vision changes. ? A serious fall or accident, or you hit your head. ? Stomach pain. ? Blood in your vomit, stool, or urine. ? A cut that will not stop bleeding.  Cough up blood.  Feel light-headed or dizzy, and that feeling does not go  away.  Cannot move your arms or legs.  Are confused or have memory loss. These symptoms may represent a serious problem that is an emergency. Do not wait to see if the symptoms will go away. Get medical help right away. Call your local emergency services (911 in the [place] Do not drive yourself to the hospital. Summary  A pulmonary embolism (PE) is a serious and potentially life-threatening condition, in which a blood clot from one part of the body (deep vein thrombosis, DVT) travels to the arteries of the lung, causing a sudden blockage or decrease of blood flow to the lungs. This may result in shortness of breath, chest pain, dizziness, and fainting.  Treatments for this condition usually include medicines to thin your blood (anticoagulants) or medicines to break apart blood clots (thrombolytics).  If you are given blood thinners, take your medicine exactly as told by your health care provider, at the same time every day. This is important.  Understand what foods and drugs interact with any medicines that you are taking.  If you have signs of PE or DVT, call your local  emergency services (911 in the [place] This information is not intended to replace advice given to you by your health care provider. Make sure you discuss any questions you have with your health care provider. Document Revised: 01/21/2019 Document Reviewed: 01/21/2019 Elsevier Patient Education  2021 [employer].

## 2020-08-17 LAB — CULTURE, BLOOD (ROUTINE X 2)
Culture: NO GROWTH
Culture: NO GROWTH
Special Requests: ADEQUATE
Special Requests: ADEQUATE

## 2021-12-01 ENCOUNTER — Other Ambulatory Visit: Payer: Self-pay

## 2021-12-01 ENCOUNTER — Emergency Department
Admission: EM | Admit: 2021-12-01 | Discharge: 2021-12-02 | Disposition: A | Discharge status: Home / Self Care | Payer: 59 | Attending: Emergency Medicine | Admitting: Emergency Medicine

## 2021-12-01 ENCOUNTER — Emergency Department: Payer: 59

## 2021-12-01 DIAGNOSIS — R079 Chest pain, unspecified: Secondary | ICD-10-CM | POA: Insufficient documentation

## 2021-12-01 DIAGNOSIS — R071 Chest pain on breathing: Secondary | ICD-10-CM

## 2021-12-01 DIAGNOSIS — Z7901 Long term (current) use of anticoagulants: Secondary | ICD-10-CM | POA: Diagnosis not present

## 2021-12-01 LAB — CBC
HCT: 43.9 % (ref 39.0–52.0)
Hemoglobin: 15.1 g/dL (ref 13.0–17.0)
MCH: 29.8 pg (ref 26.0–34.0)
MCHC: 34.4 g/dL (ref 30.0–36.0)
MCV: 86.8 fL (ref 80.0–100.0)
Platelets: 253 10*3/uL (ref 150–400)
RBC: 5.06 MIL/uL (ref 4.22–5.81)
RDW: 12.6 % (ref 11.5–15.5)
WBC: 9.6 10*3/uL (ref 4.0–10.5)
nRBC: 0 % (ref 0.0–0.2)

## 2021-12-01 LAB — BASIC METABOLIC PANEL
Anion gap: 10 (ref 5–15)
BUN: 20 mg/dL (ref 6–20)
CO2: 23 mmol/L (ref 22–32)
Calcium: 9 mg/dL (ref 8.9–10.3)
Chloride: 105 mmol/L (ref 98–111)
Creatinine, Ser: 0.93 mg/dL (ref 0.61–1.24)
GFR, Estimated: >60 mL/min (ref >60)
Glucose, Bld: 104 mg/dL — ABNORMAL HIGH (ref 70–99)
Potassium: 3.4 mmol/L — ABNORMAL LOW (ref 3.5–5.1)
Sodium: 138 mmol/L (ref 135–145)

## 2021-12-01 LAB — TROPONIN I (HIGH SENSITIVITY)
Troponin I (High Sensitivity): 4 ng/L (ref <18)
Troponin I (High Sensitivity): 4 ng/L (ref <18)

## 2021-12-01 LAB — PROTIME-INR
INR: 3.6 — ABNORMAL HIGH (ref 0.8–1.2)
Prothrombin Time: 35.8 seconds — ABNORMAL HIGH (ref 11.4–15.2)

## 2021-12-01 MED ORDER — IOHEXOL 350 MG/ML SOLN
75.0000 mL | Freq: Once | INTRAVENOUS | Status: AC | PRN
  Administered 2021-12-01: 75 mL via INTRAVENOUS

## 2021-12-01 NOTE — ED Triage Notes (Signed)
Pt states history of PE. Pt states today has been having intermittent shob with some associated off and on chest pain for a week. Pt takes coumadin.

## 2021-12-02 MED ORDER — DILTIAZEM HCL-DEXTROSE 125-5 MG/125ML-% IV SOLN (PREMIX): 5.0000 mg/h | INTRAVENOUS | Status: DC

## 2021-12-02 NOTE — ED Provider Notes (Signed)
[facility] Provider Note    Event Date/Time   First MD Initiated Contact with Patient 12/01/21 2120     (approximate)   History   Chest Pain   HPI  Timothy Keller is a 61 y.o. male who has a history of pulmonary embolus.  He takes Coumadin for.  He has not had level checked for a while.  He reports he has been on a long trip and has had intermittent shortness of breath and some chest pain off and on for about a week.  He is worried he may have another PE.  He is not running a fever and has not been running a fever.  He has not been coughing.  He is not currently short of breath.      Physical Exam   Triage Vital Signs: ED Triage Vitals  Enc Vitals Group     BP 12/01/21 2037 (!) 144/96     Pulse Rate 12/01/21 2037 94     Resp 12/01/21 2037 (!) 22     Temp 12/01/21 2037 98 F (36.7 C)     Temp Source 12/01/21 2037 Oral     SpO2 12/01/21 2037 100 %     Weight 12/01/21 2038 193 lb (87.5 kg)     Height 12/01/21 2038 5\' 9"  (1.753 m)     Head Circumference --      Peak Flow --      Pain Score --      Pain Loc --      Pain Edu? --      Excl. in GC? --     Most recent vital signs: Vitals:   12/01/21 2330 12/02/21 0000  BP: (!) 140/91 (!) 140/96  Pulse: 77 72  Resp: 15 14  Temp:    SpO2: 94% 95%    General: Awake, no distress.  CV:  Good peripheral perfusion.  Heart regular rate and rhythm no audible murmurs Resp:  Normal effort.  Lungs are clear Abd:  No distention.  Soft and nontender Extremities with no edema   ED Results / Procedures / Treatments   Labs (all labs ordered are listed, but only abnormal results are displayed) Labs Reviewed  BASIC METABOLIC PANEL - Abnormal; Notable for the following components:      Result Value   Potassium 3.4 (*)    Glucose, Bld 104 (*)    All other components within normal limits  PROTIME-INR - Abnormal; Notable for the following components:   Prothrombin Time 35.8 (*)    INR 3.6 (*)    All  other components within normal limits  CBC  TROPONIN I (HIGH SENSITIVITY)  TROPONIN I (HIGH SENSITIVITY)     EKG  EKG read and interpreted by me shows normal sinus rhythm rate of 90 right bundle branch block no acute ST-T wave changes CT of the chest to look for pulmonary embolus was read by radiology reviewed and interpreted by me did not show any pulmonary emboli.  It did show some pulmonary nodules.  It did show a small amount of groundglass increased markings of uncertain significance.  Patient does not have any current symptoms of infection no fever no cough no shortness of breath no white count.  I discussed this with him and provided him a copy of the results and will have him follow-up with his primary care doctor.  Additionally he will have to have his INR adjusted slightly because it is somewhat high at 3.6.  His  troponins are negative which is a good thing.  Otherwise he is doing well.  RADIOLOGY X-ray read by radiology reviewed and interpreted by me does not show any acute pathology   PROCEDURES:  Critical Care performed: Yes, see critical care procedure note(s)  Procedures   MEDICATIONS ORDERED IN ED: Medications  iohexol (OMNIPAQUE) 350 MG/ML injection 75 mL (75 mLs Intravenous Contrast Given 12/01/21 2229)     IMPRESSION / MDM / ASSESSMENT AND PLAN / ED COURSE  I reviewed the triage vital signs and the nursing notes. We will be able to discharge the patient with close follow-up by his primary care doctor for his INR and keep an eye on his breathing for the groundglass areas which are minimal.  And also follow-up for the pulmonary nodules.    Patient's presentation is most consistent with acute complicated illness / injury requiring diagnostic workup.  The patient is on the cardiac monitor to evaluate for evidence of arrhythmia and/or significant heart rate changes.  None were seen      FINAL CLINICAL IMPRESSION(S) / ED DIAGNOSES   Final diagnoses:  Chest  pain on breathing   Should be chest pain on breathing resolved  Rx / DC Orders   ED Discharge Orders     None        Note:  This document was prepared using Dragon voice recognition software and may include unintentional dictation errors.   Arnaldo Natal, MD 12/02/21 [phone]

## 2021-12-02 NOTE — Discharge Instructions (Addendum)
Your INR today was 3.6.  The results of the CT scan did not show any blood clots.  Copy of the final report is below 1. No evidence for pulmonary embolism.  2. Minimal patchy ground-glass opacities throughout both lungs may  be infectious/inflammatory.  3. Multiple pulmonary nodules. Most significant: 8 mm left solid  pulmonary nodule within the upper lobe. Per Fleischner Society  Guidelines, recommend a non-contrast Chest CT at 3-6 months. If  patient is high risk for malignancy, recommend an additional  non-contrast Chest CT at 18-24 months; if patient is low risk for  malignancy a non-contrast Chest CT at 18-24 months is optional.  These guidelines do not apply to immunocompromised patients and  patients with cancer. Follow up in patients with significant  comorbidities as clinically warranted. For lung cancer screening,  adhere to Lung-RADS guidelines. Reference: Radiology. 2017;  284(1):228-43.  Since you are not having any coughing or fever I will not address the minimal groundglass opacities that the radiologist mentioned.  I would show the CT report to your doctor.  They might want to give you antibiotics if you develop any fever or cough or shortness of breath.  You will need to get the pulmonary nodules followed up by your doctor in the future.  I would call your doctor currently and let him know what your INR is and see if he wants to adjust your Coumadin dose again or just repeat it.   If you want to try and get a doctor more locally you can try the [facility] or the [facility] for the [facility] or the [facility] or the [facility] medical group.  They are all in the area and have good doctors.  Please return for any fever or coughing or shortness of breath or other problems.

## 2022-10-19 IMAGING — DX DG CHEST 1V PORT
2 series · 2 of 2 positions shown · non-contrast
Comparison: None.

CLINICAL DATA: Shortness of breath, productive cough.

EXAM:
PORTABLE CHEST 1 VIEW

[chest ap (1 of 2)]
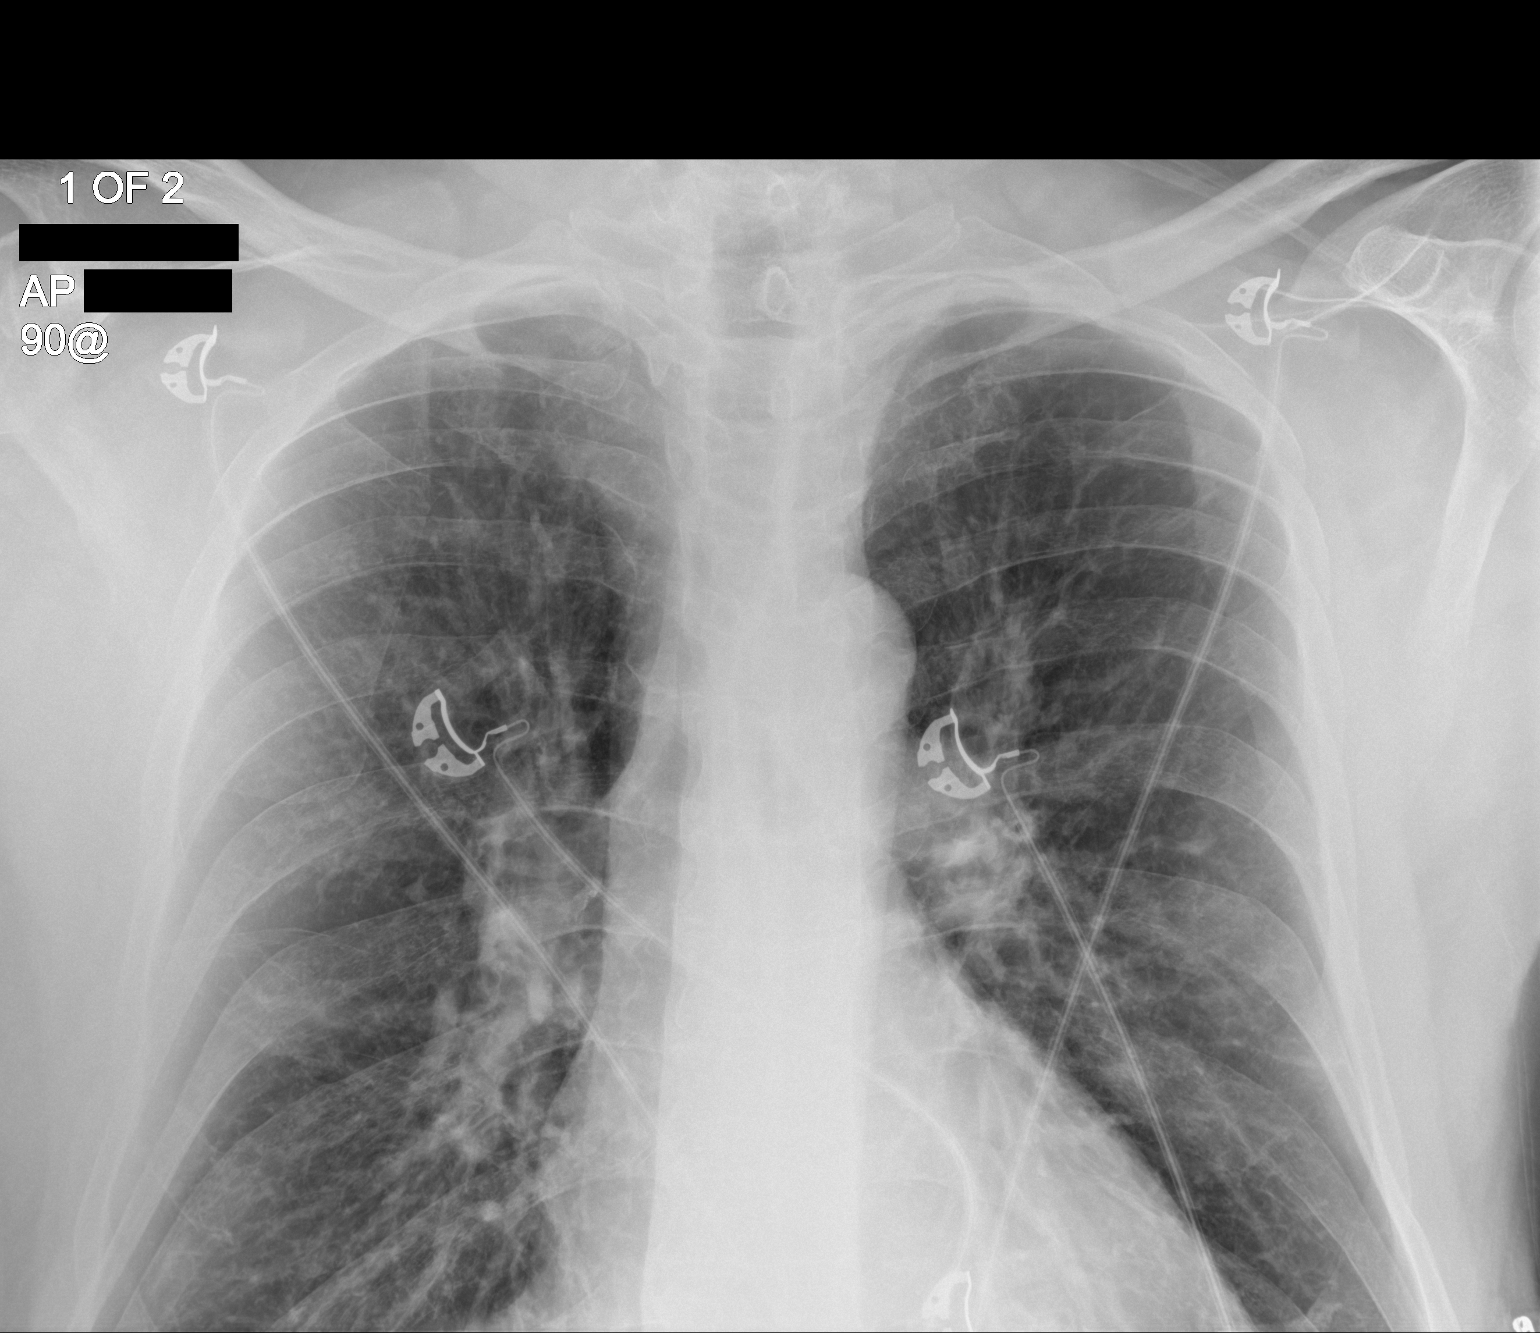

[chest ap (2 of 2)]
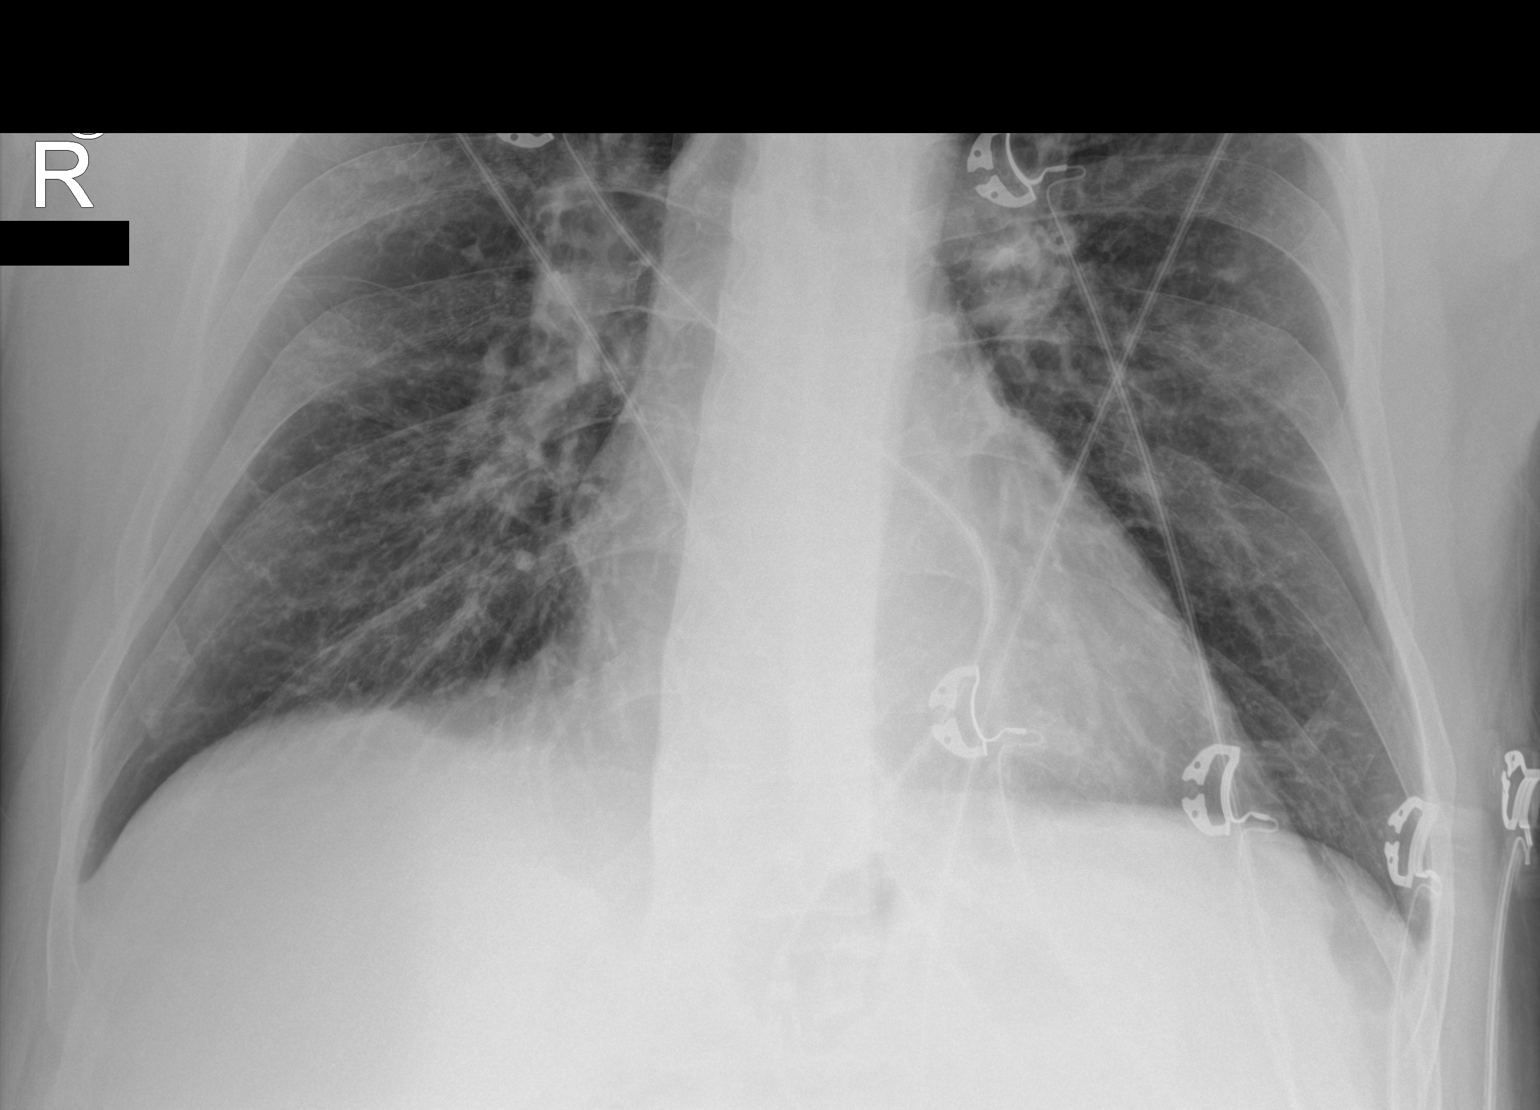

[2 of 2 positions shown; findings below may reference images not displayed]

FINDINGS: The heart size and mediastinal contours are within normal limits.
Both lungs are clear. The visualized skeletal structures are
unremarkable.
IMPRESSION: No active disease.

## 2022-10-20 IMAGING — NM NM PULMONARY PERF PARTICULATE
1 series · 8 of 8 positions shown · non-contrast
Comparison: None

Correlation: Chest radiograph 08/12/2020

CLINICAL DATA: COPD, asthma, sick for 3 days, VCA40-5L negative;
history asthma, COPD, hypertension

EXAM:
NUCLEAR MEDICINE PERFUSION LUNG SCAN
TECHNIQUE: Perfusion images were obtained in multiple projections after
intravenous injection of radiopharmaceutical.
Ventilation scans intentionally deferred if perfusion scan and chest
x-ray adequate for interpretation during COVID 19 epidemic.
RADIOPHARMACEUTICALS:  4.18 mCi 6c-GGm MAA IV

[Series 1000: lung perfusion · 1.65mm/px · 4 acquisitions, 8 frames shown]
[im 1/4]
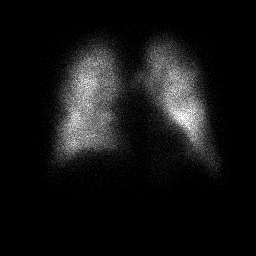
[im 1/4]
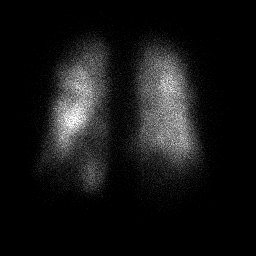
[im 2/4]
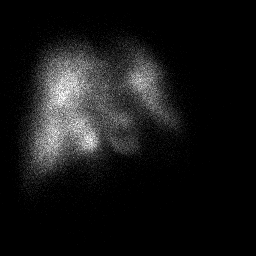
[im 2/4]
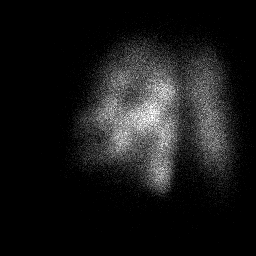
[im 3/4]
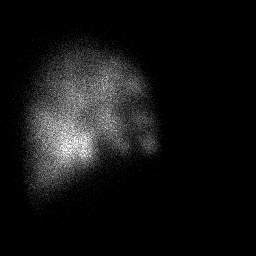
[im 3/4]
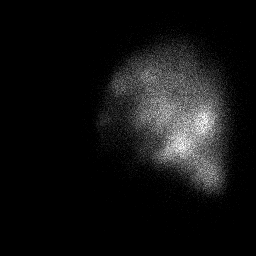
[im 4/4]
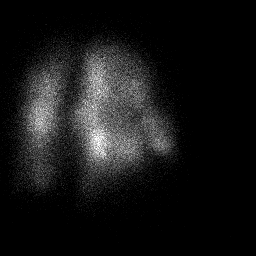
[im 4/4]
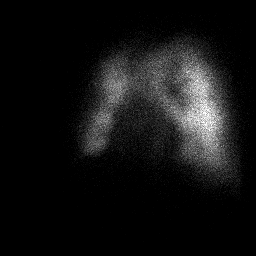

[8 of 8 positions shown; findings below may reference images not displayed]

FINDINGS: Segmental perfusion defect LEFT lower lobe.

Subsegmental perfusion defects elsewhere in both lungs.

Findings meet criteria for pulmonary embolism present.
IMPRESSION: Segmental and subsegmental perfusion defects as above.

Positive exam for pulmonary embolism.

Findings called to Dr. Neupane on 08/13/2020 at 6871 hours.

## 2022-12-19 ENCOUNTER — Emergency Department
Admission: EM | Admit: 2022-12-19 | Discharge: 2022-12-20 | Disposition: A | Discharge status: Home / Self Care | Payer: 59 | Attending: Emergency Medicine | Admitting: Emergency Medicine

## 2022-12-19 ENCOUNTER — Emergency Department: Payer: 59

## 2022-12-19 DIAGNOSIS — Z7901 Long term (current) use of anticoagulants: Secondary | ICD-10-CM | POA: Diagnosis not present

## 2022-12-19 DIAGNOSIS — M79662 Pain in left lower leg: Secondary | ICD-10-CM | POA: Diagnosis present

## 2022-12-19 DIAGNOSIS — M79605 Pain in left leg: Secondary | ICD-10-CM

## 2022-12-19 DIAGNOSIS — M7989 Other specified soft tissue disorders: Secondary | ICD-10-CM | POA: Insufficient documentation

## 2022-12-19 NOTE — ED Notes (Signed)
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 Currently on coumadin. Pt states coumadin levels were therapeutic when tested this morning. Pt denies current SOB.

## 2022-12-19 NOTE — ED Triage Notes (Signed)
Patient presents to the ED for left leg tenderness, swelling, and pain (objective findings consistent).  Resp even and unlabored. Skin pwd. Ambulatory to triage. Patient is on warfarin and hx of blood clots. Nad noted.

## 2022-12-20 NOTE — Discharge Instructions (Signed)
Your workup in the Emergency Department today was reassuring.  We did not find any specific abnormalities.  We recommend you drink plenty of fluids, take your regular medications and/or any new ones prescribed today, and follow up with the doctor(s) listed in these documents as recommended.  Return to the Emergency Department if you develop new or worsening symptoms that concern you.  

## 2022-12-20 NOTE — ED Provider Notes (Signed)
[facility] Provider Note    Event Date/Time   First MD Initiated Contact with Patient 12/19/22 2343     (approximate)   History   Leg Swelling   HPI Timothy Keller is a 62 y.o. male who presents for evaluation of pain in his left lower leg.  He has a history of PEs and is on warfarin.  When he started having some pain in the middle part of his left lower leg, he was concerned it might be a blood clot.  He feels there might be some swelling of the left compared to the right.  Symptoms just started over the last day or so.  He has no other symptoms.  He has been compliant with his medication.  No numbness nor tingling.     Physical Exam   Triage Vital Signs: ED Triage Vitals  Encounter Vitals Group     BP 12/19/22 2118 (!) 151/102     Systolic BP Percentile --      Diastolic BP Percentile --      Pulse Rate 12/19/22 2118 94     Resp 12/19/22 2118 17     Temp 12/19/22 2118 99.2 F (37.3 C)     Temp Source 12/19/22 2118 Oral     SpO2 12/19/22 2118 94 %     Weight 12/19/22 2120 86.2 kg (190 lb)     Height 12/19/22 2120 1.765 m (5' 9.5")     Head Circumference --      Peak Flow --      Pain Score 12/19/22 2118 2     Pain Loc --      Pain Education --      Exclude from Growth Chart --     Most recent vital signs: Vitals:   12/20/22 0000 12/20/22 0030  BP: 136/86 (!) 133/98  Pulse: 89 82  Resp: 18 17  Temp:    SpO2: 96% 97%    General: Awake, no distress.  CV:  Good peripheral perfusion.  Regular rate and rhythm.  Easily palpable distal pulse in his left foot. Resp:  Normal effort. Speaking easily and comfortably, no accessory muscle usage nor intercostal retractions.   Abd:  No distention.  Other:  I see no visible difference in size between the left and the right leg.  He has no evidence of infection, no tenderness to palpation, no palpable deformities along the vascular structures.   ED Results / Procedures / Treatments   Labs (all  labs ordered are listed, but only abnormal results are displayed) Labs Reviewed - No data to display   RADIOLOGY I viewed and interpreted the patient's ultrasound and see no evidence of DVT.  I also read the radiologist's report, which confirmed no acute findings.   PROCEDURES:  Critical Care performed: No  Procedures    IMPRESSION / MDM / ASSESSMENT AND PLAN / ED COURSE  I reviewed the triage vital signs and the nursing notes.                              Differential diagnosis includes, but is not limited to, musculoskeletal pain, bruise/contusion, DVT.  Patient's presentation is most consistent with acute presentation with potential threat to life or bodily function.  Labs/studies ordered: Left lower extremity ultrasound  Interventions/Medications given:  Medications - No data to display  (Note:  hospital course my include additional interventions and/or labs/studies not listed above.)  No DVT on ultrasound.  No clear explanation for the patient's symptoms but I suspect it is musculoskeletal.  He is reassured and is very comfortable with the plan for discharge and outpatient follow-up.  I gave my usual return precautions.  No indication for additional imaging or lab work at this time.         FINAL CLINICAL IMPRESSION(S) / ED DIAGNOSES   Final diagnoses:  Left leg pain     Rx / DC Orders   ED Discharge Orders     None        Note:  This document was prepared using Dragon voice recognition software and may include unintentional dictation errors.   Loleta Rose, MD 12/20/22 [phone]

## 2024-03-12 ENCOUNTER — Emergency Department

## 2024-03-12 ENCOUNTER — Emergency Department: Admission: EM | Admit: 2024-03-12 | Discharge: 2024-03-12 | Disposition: A | Discharge status: Home / Self Care

## 2024-03-12 ENCOUNTER — Encounter: Payer: Self-pay | Admitting: Emergency Medicine

## 2024-03-12 ENCOUNTER — Other Ambulatory Visit: Payer: Self-pay

## 2024-03-12 DIAGNOSIS — Z86711 Personal history of pulmonary embolism: Secondary | ICD-10-CM | POA: Insufficient documentation

## 2024-03-12 DIAGNOSIS — R531 Weakness: Secondary | ICD-10-CM | POA: Diagnosis not present

## 2024-03-12 DIAGNOSIS — Z7901 Long term (current) use of anticoagulants: Secondary | ICD-10-CM | POA: Diagnosis not present

## 2024-03-12 DIAGNOSIS — J449 Chronic obstructive pulmonary disease, unspecified: Secondary | ICD-10-CM | POA: Diagnosis not present

## 2024-03-12 DIAGNOSIS — R051 Acute cough: Secondary | ICD-10-CM

## 2024-03-12 DIAGNOSIS — R059 Cough, unspecified: Secondary | ICD-10-CM | POA: Diagnosis present

## 2024-03-12 DIAGNOSIS — J40 Bronchitis, not specified as acute or chronic: Secondary | ICD-10-CM | POA: Diagnosis not present

## 2024-03-12 LAB — BASIC METABOLIC PANEL WITH GFR
Anion gap: 11 (ref 5–15)
BUN: 15 mg/dL (ref 8–23)
CO2: 28 mmol/L (ref 22–32)
Calcium: 9 mg/dL (ref 8.9–10.3)
Chloride: 101 mmol/L (ref 98–111)
Creatinine, Ser: 0.81 mg/dL (ref 0.61–1.24)
GFR, Estimated: >60 mL/min
Glucose, Bld: 96 mg/dL (ref 70–99)
Potassium: 3.8 mmol/L (ref 3.5–5.1)
Sodium: 140 mmol/L (ref 135–145)

## 2024-03-12 LAB — CBC
HCT: 44.9 % (ref 39.0–52.0)
Hemoglobin: 15.2 g/dL (ref 13.0–17.0)
MCH: 29.7 pg (ref 26.0–34.0)
MCHC: 33.9 g/dL (ref 30.0–36.0)
MCV: 87.9 fL (ref 80.0–100.0)
Platelets: 213 K/uL (ref 150–400)
RBC: 5.11 MIL/uL (ref 4.22–5.81)
RDW: 12.7 % (ref 11.5–15.5)
WBC: 8.5 K/uL (ref 4.0–10.5)
nRBC: 0 % (ref 0.0–0.2)

## 2024-03-12 LAB — RESP PANEL BY RT-PCR (RSV, FLU A&B, COVID)  RVPGX2
Influenza A by PCR: NEGATIVE
Influenza B by PCR: NEGATIVE
Resp Syncytial Virus by PCR: NEGATIVE
SARS Coronavirus 2 by RT PCR: NEGATIVE

## 2024-03-12 LAB — PROTIME-INR
INR: 2 — ABNORMAL HIGH (ref 0.8–1.2)
Prothrombin Time: 23.5 s — ABNORMAL HIGH (ref 11.4–15.2)

## 2024-03-12 LAB — PROCALCITONIN: Procalcitonin: <0.1 ng/mL

## 2024-03-12 LAB — TROPONIN T, HIGH SENSITIVITY
Troponin T High Sensitivity: 19 ng/L (ref 0–19)
Troponin T High Sensitivity: 17 ng/L (ref 0–19)

## 2024-03-12 MED ORDER — IPRATROPIUM-ALBUTEROL 0.5-2.5 (3) MG/3ML IN SOLN
3.0000 mL | Freq: Once | RESPIRATORY_TRACT | Status: AC
  Administered 2024-03-12: 3 mL via RESPIRATORY_TRACT
  Filled 2024-03-12: qty 3

## 2024-03-12 MED ORDER — ALBUTEROL SULFATE 0.63 MG/3ML IN NEBU: 1.0000 | INHALATION_SOLUTION | Freq: Four times a day (QID) | RESPIRATORY_TRACT | 12 refills | Status: AC | PRN

## 2024-03-12 MED ORDER — IOHEXOL 350 MG/ML SOLN
75.0000 mL | Freq: Once | INTRAVENOUS | Status: AC | PRN
  Administered 2024-03-12: 75 mL via INTRAVENOUS

## 2024-03-12 NOTE — ED Notes (Signed)
 Not in room

## 2024-03-12 NOTE — Discharge Instructions (Addendum)
 Check your INR every 48-72 hours while on abx. Give yourself a breathing treatment every 4-6 hours  At time of discharge there is no evidence of acute life, limb, vision, or fertility threat. Patient has stable vital signs, pain is well controlled, patient is ambulatory and p.o. tolerant.  Discharge instructions were completed using the EPIC system. I would refer you to those at this time. All warnings prescriptions follow-up etc. were discussed in detail with the patient. Patient indicates understanding and is agreeable with this plan. All questions answered.  Patient is made aware that they may return to the emergency department for any worsening or new condition or for any other emergency.

## 2024-03-12 NOTE — ED Provider Notes (Signed)
 "  [facility] Provider Note    Event Date/Time   First MD Initiated Contact with Patient 03/12/24 1529     (approximate)   History   Chest Pain and Cough   HPI  Timothy Keller is a 63 y.o. male with COPD with ongoing vaping, prior pulmonary embolism on Coumadin who presents to the emergency department with 2 weeks of progressively worsening cough and general weakness.  Patient states that he was initially treated by a telehealth provider 2 weeks ago with a Z-Pak and prednisone  which he took to completion.  He continued to have nasal congestion and cough and saw an urgent care physician 3 days ago and was prescribed doxycycline and Augmentin which he has been taken.  His last breathing treatment was this morning.  He denies any chest pain abdominal pain nausea vomiting changes in urinary or bowel habits.  Reports that he gets his INR checked regularly.      Physical Exam   Triage Vital Signs: ED Triage Vitals  Encounter Vitals Group     BP 03/12/24 1249 (!) 139/92     Girls Systolic BP Percentile --      Girls Diastolic BP Percentile --      Boys Systolic BP Percentile --      Boys Diastolic BP Percentile --      Pulse Rate 03/12/24 1249 (!) 106     Resp 03/12/24 1249 20     Temp 03/12/24 1249 98.6 F (37 C)     Temp Source 03/12/24 1249 Oral     SpO2 03/12/24 1249 95 %     Weight 03/12/24 1248 189 lb 9.5 oz (86 kg)     Height 03/12/24 1248 5' 9.5 (1.765 m)     Head Circumference --      Peak Flow --      Pain Score 03/12/24 1250 5     Pain Loc --      Pain Education --      Exclude from Growth Chart --     Most recent vital signs: Vitals:   03/12/24 1615 03/12/24 1620  BP: (!) 142/83   Pulse: 94 94  Resp: 19 18  Temp:    SpO2: 98% 98%    Nursing Triage Note reviewed. Vital signs reviewed and patients oxygen saturation is normoxic  General: Patient is well nourished, well developed, awake and alert, resting comfortably in no acute  distress Head: Normocephalic and atraumatic Eyes: Normal inspection, extraocular muscles intact, no conjunctival pallor Ear, nose, throat: Normal external exam Neck: Normal range of motion Respiratory: Patient is in no respiratory distress, lungs with slight wheezes Cardiovascular: Patient is not tachycardic, RRR without murmur appreciated GI: Abd SNT with no guarding or rebound  Back: Normal inspection of the back with good strength and range of motion throughout all ext Extremities: pulses intact with good cap refills, no LE pitting edema or calf tenderness Neuro: The patient is alert and oriented to person, place, and time, appropriately conversive, with 5/5 bilat UE/LE strength, no gross motor or sensory defects noted. Coordination appears to be adequate. Skin: Warm, dry, and intact Psych: normal mood and affect, no SI or HI  ED Results / Procedures / Treatments   Labs (all labs ordered are listed, but only abnormal results are displayed) Labs Reviewed  PROTIME-INR - Abnormal; Notable for the following components:      Result Value   Prothrombin Time 23.5 (*)    INR 2.0 (*)  All other components within normal limits  RESP PANEL BY RT-PCR (RSV, FLU A&B, COVID)  [id number]  BASIC METABOLIC PANEL WITH GFR  CBC  PROCALCITONIN  TROPONIN T, HIGH SENSITIVITY  TROPONIN T, HIGH SENSITIVITY     EKG EKG and rhythm strip are interpreted by myself:   EKG: Tachycardic sinus rhythm] at heart rate of 101, normal QRS duration, QTc 469, nonspecific ST segments and T waves no ectopy EKG not consistent with Acute STEMI Rhythm strip: Tachycardic sinus rhythm in lead II   RADIOLOGY Chest x-ray: No acute abnormality on my independent review interpretation radiologist agrees CT PE: No pulmonary embolism but consistent with bronchitis on radiologist review and my independent review interpretation    PROCEDURES:  Critical Care performed: No  Procedures   MEDICATIONS ORDERED IN  ED: Medications  iohexol  (OMNIPAQUE ) 350 MG/ML injection 75 mL (75 mLs Intravenous Contrast Given 03/12/24 1627)  ipratropium-albuterol  (DUONEB) 0.5-2.5 (3) MG/3ML nebulizer solution 3 mL (3 mLs Nebulization Given 03/12/24 1750)     IMPRESSION / MDM / ASSESSMENT AND PLAN / ED COURSE                                Differential diagnosis includes, but is not limited to COPD exacerbation, PE, pneumonia, URI, atypical ACS, electrolyte derangement anemia  ED course: Patient is well-appearing and satting well on room air.  He does arrive borderline tachycardic and does have a normal BP and given the history of pulmonary embolism I am concerned about the possibility of repeat PE.  COVID swab was unremarkable he had no leukocytosis no anemia no profound electrolyte derangements.  His INR was within normal limits.  His troponin was not elevated.  CT PE demonstrated no evidence of pneumonia or pulmonary embolism.  His symptoms improved with a DuoNeb.  I did send him with a prescription for albuterol  nebulizers.  He was counseled that while on antibiotics he should have his INR more frequently checked.  All questions answered patient voiced understanding and requested discharge   Clinical Course as of 03/12/24 2335  Sat Mar 12, 2024  1635 INR(!): 2.0 Within patient's goal level today [HD]  1721 Patient reassessed, feels improved.  Reviewed the results.  Encouraged him to continue to check his INR every 48-72 hours while on antibiotics.  Feels comfortable returning home [HD]  1722 Procalcitonin: <0.10 Not elevated [HD]  1722 Resp panel by RT-PCR (RSV, Flu A&B, Covid) Anterior Nasal Swab Negative [HD]  1722 Troponin T High Sensitivity: 17 Negative [HD]  1722 Basic metabolic panel No electrolyte derangements [HD]  1722 CBC No leukocytosis [HD]    Clinical Course User Index [HD] Nicholaus Rolland BRAVO, MD   At time of discharge there is no evidence of acute life, limb, vision, or fertility threat.  Patient has stable vital signs, pain is well controlled, patient is ambulatory and p.o. tolerant.  Discharge instructions were completed using the EPIC system. I would refer you to those at this time. All warnings prescriptions follow-up etc. were discussed in detail with the patient. Patient indicates understanding and is agreeable with this plan. All questions answered.  Patient is made aware that they may return to the emergency department for any worsening or new condition or for any other emergency.  -- Risk: 5 This patient has a high risk of morbidity due to further diagnostic testing or treatment. Rationale: This patients evaluation and management involve a high risk of morbidity due  to the potential severity of presenting symptoms, need for diagnostic testing, and/or initiation of treatment that may require close monitoring. The differential includes conditions with potential for significant deterioration or requiring escalation of care. Treatment decisions in the ED, including medication administration, procedural interventions, or disposition planning, reflect this level of risk. COPA: 5 The patient has the following acute or chronic illness/injury that poses a possible threat to life or bodily function: [X] : The patient has a potentially serious acute condition or an acute exacerbation of a chronic illness requiring urgent evaluation and management in the Emergency Department. The clinical presentation necessitates immediate consideration of life-threatening or function-threatening diagnoses, even if they are ultimately ruled out.   FINAL CLINICAL IMPRESSION(S) / ED DIAGNOSES   Final diagnoses:  Acute cough  Bronchitis     Rx / DC Orders   ED Discharge Orders          Ordered    albuterol  (ACCUNEB ) 0.63 MG/3ML nebulizer solution  Every 6 hours PRN        03/12/24 1723             Note:  This document was prepared using Dragon voice recognition software and may include  unintentional dictation errors.   Nicholaus Rolland BRAVO, MD 03/12/24 [phone]  "

## 2024-03-12 NOTE — ED Notes (Signed)
 Denies questions or needs, VSS, ready to go, feel better, Rx given

## 2024-03-12 NOTE — ED Notes (Signed)
 Ambulatory back to room, alert, NAD, calm, interactive, resps e/u, steady gait.

## 2024-03-12 NOTE — ED Triage Notes (Signed)
 Pt went to [facility] Wednesday but not feeling better. Pt had xray and started on abx for possible PNA. Pt reports chest tightness and cough for a few days. Reports daughter and wife had a cough. Pain with inhalation.

## 2024-03-12 NOTE — Telephone Encounter (Signed)
 Called patient, DOB verified, advised his chest x-ray over read was negative for cardiopulmonary disease. I saw him 3-4 days ago and treated for suspected pneumonia. He reports not significantly getting better. Advised to seek care at the ED and he verbalizes understanding. He will go to the ED today for evaluation.
